# Patient Record
Sex: Female | Born: 1995 | Race: Black or African American | Marital: Single | Smoking: Never smoker
Health system: Southern US, Community
[De-identification: ages and names within clinical notes are randomized; demographics above are authoritative.]

---

## 2017-04-02 NOTE — L&D Delivery Note (Addendum)
Patient: Kim Blanchard MRN: [id number]  GBS status: unknown, IAP given  (PCN)  Patient is a 22 y.o. now G1P0101 s/p NSVD at [redacted]w[redacted]d, who was admitted for PPROM. SROM 64h 19m prior to delivery with clear fluid.    Delivery Note At 9:13 PM a viable female was delivered via Vaginal, Spontaneous (Presentation: ROA.)  APGAR: 7,9 ; weight pending.   Placenta status: Spontaneous, intact.  Cord: 3 vessel, intact with the following complications: none   Anesthesia: Epidural  Episiotomy: None Lacerations: hemostatic labial abrasion  Suture Repair: N/A Est. Blood Loss (mL): 50   Head delivered right OA. No nuchal cord present. Shoulder and body delivered in usual fashion. Infant with spontaneous cry, placed on mother's abdomen, dried and bulb suctioned. Cord clamped x 2 after 1-minute delay, and cut by family member. NICU at bedside via delivery call for further evaluation of pre-term infant. Cord blood drawn. Placenta delivered spontaneously with gentle cord traction. Fundus firm with massage and Pitocin. Perineum inspected and found to have two superficial labial abrasions, which were found to be hemostatic.  Mom to postpartum.  Baby to NICU.   Allayne Stack 01/18/2018, 9:26 PM  Attestation: I have seen this patient and agree with the resident's documentation. I was present for the entire delivery.   Cristal Deer. Earlene Plater, DO OB/GYN Fellow

## 2017-07-31 ENCOUNTER — Ambulatory Visit: Payer: Self-pay

## 2017-08-05 ENCOUNTER — Ambulatory Visit: Payer: Self-pay

## 2017-08-08 ENCOUNTER — Emergency Department (HOSPITAL_COMMUNITY)
Admission: EM | Admit: 2017-08-08 | Discharge: 2017-08-08 | Disposition: A | Discharge status: Home / Self Care | Payer: Medicaid Other | Attending: Emergency Medicine | Admitting: Emergency Medicine

## 2017-08-08 ENCOUNTER — Other Ambulatory Visit (HOSPITAL_COMMUNITY): Payer: Self-pay

## 2017-08-08 ENCOUNTER — Other Ambulatory Visit: Payer: Self-pay

## 2017-08-08 ENCOUNTER — Emergency Department (HOSPITAL_COMMUNITY): Payer: Medicaid Other

## 2017-08-08 ENCOUNTER — Encounter (HOSPITAL_COMMUNITY): Payer: Self-pay | Admitting: Emergency Medicine

## 2017-08-08 DIAGNOSIS — O219 Vomiting of pregnancy, unspecified: Secondary | ICD-10-CM

## 2017-08-08 DIAGNOSIS — Z3A11 11 weeks gestation of pregnancy: Secondary | ICD-10-CM | POA: Diagnosis not present

## 2017-08-08 DIAGNOSIS — R102 Pelvic and perineal pain: Secondary | ICD-10-CM | POA: Diagnosis not present

## 2017-08-08 DIAGNOSIS — E860 Dehydration: Secondary | ICD-10-CM | POA: Insufficient documentation

## 2017-08-08 LAB — CBC WITH DIFFERENTIAL/PLATELET
BASOS PCT: 0 %
Basophils Absolute: 0 10*3/uL (ref 0.0–0.1)
EOS PCT: 0 %
Eosinophils Absolute: 0 10*3/uL (ref 0.0–0.7)
HCT: 38 % (ref 36.0–46.0)
HEMOGLOBIN: 13.6 g/dL (ref 12.0–15.0)
Lymphocytes Relative: 39 %
Lymphs Abs: 2.1 10*3/uL (ref 0.7–4.0)
MCH: 30.3 pg (ref 26.0–34.0)
MCHC: 35.8 g/dL (ref 30.0–36.0)
MCV: 84.6 fL (ref 78.0–100.0)
MONO ABS: 0.4 10*3/uL (ref 0.1–1.0)
MONOS PCT: 7 %
NEUTROS PCT: 54 %
Neutro Abs: 3 10*3/uL (ref 1.7–7.7)
Platelets: 233 10*3/uL (ref 150–400)
RBC: 4.49 MIL/uL (ref 3.87–5.11)
RDW: 11.9 % (ref 11.5–15.5)
WBC: 5.5 10*3/uL (ref 4.0–10.5)

## 2017-08-08 LAB — I-STAT BETA HCG BLOOD, ED (MC, WL, AP ONLY): I-stat hCG, quantitative: >2000 m[IU]/mL — ABNORMAL HIGH (ref <5)

## 2017-08-08 LAB — COMPREHENSIVE METABOLIC PANEL
ALK PHOS: 49 U/L (ref 38–126)
ALT: 20 U/L (ref 14–54)
AST: 18 U/L (ref 15–41)
Albumin: 3.9 g/dL (ref 3.5–5.0)
Anion gap: 7 (ref 5–15)
BILIRUBIN TOTAL: 0.8 mg/dL (ref 0.3–1.2)
CALCIUM: 9.3 mg/dL (ref 8.9–10.3)
CHLORIDE: 107 mmol/L (ref 101–111)
CO2: 22 mmol/L (ref 22–32)
CREATININE: 0.67 mg/dL (ref 0.44–1.00)
GFR calc non Af Amer: >60 mL/min (ref >60)
GLUCOSE: 95 mg/dL (ref 65–99)
Potassium: 4.1 mmol/L (ref 3.5–5.1)
Sodium: 136 mmol/L (ref 135–145)
Total Protein: 6.6 g/dL (ref 6.5–8.1)

## 2017-08-08 LAB — URINALYSIS, ROUTINE W REFLEX MICROSCOPIC
Bilirubin Urine: NEGATIVE
GLUCOSE, UA: NEGATIVE mg/dL
HGB URINE DIPSTICK: NEGATIVE
Ketones, ur: 80 mg/dL — AB
LEUKOCYTES UA: NEGATIVE
Nitrite: NEGATIVE
PH: 6 (ref 5.0–8.0)
Protein, ur: NEGATIVE mg/dL
Specific Gravity, Urine: 1.024 (ref 1.005–1.030)

## 2017-08-08 LAB — LIPASE, BLOOD: Lipase: 26 U/L (ref 11–51)

## 2017-08-08 MED ORDER — ONDANSETRON HCL 4 MG PO TABS
4.0000 mg | ORAL_TABLET | Freq: Once | ORAL | Status: AC
  Administered 2017-08-08: 4 mg via ORAL
  Filled 2017-08-08: qty 1

## 2017-08-08 MED ORDER — ONDANSETRON HCL 4 MG PO TABS: 4.0000 mg | ORAL_TABLET | Freq: Three times a day (TID) | ORAL | 0 refills | Status: DC | PRN

## 2017-08-08 NOTE — ED Triage Notes (Signed)
Primary language is [place]. Pt states she went to work at [employer] yesterday and had a sudden feeling of dizziness. Reports on going dizziness when standing for too long or when she first stands and states she has been having lower abdominal pain. Pt is 2 months pregnant in her first pregnancy.

## 2017-08-08 NOTE — ED Provider Notes (Signed)
Patient placed in Quick Look pathway, seen and evaluated   Chief Complaint: Dizziness, lower abdominal cramping, near syncope  HPI:   Patient reports since yesterday she been having dizziness that she describes as near syncope and lightheadedness.  Worse when she goes from sitting to standing or walks for prolonged periods.  Denies any chest pain or shortness of breath.  She does report some lower abdominal cramping.  Reports nausea and vomiting.  Patient is approximately 2 months pregnant with her first pregnancy has not seen a OB/GYN.  Denies any leakage of fluid, vaginal bleeding.  Denies any urinary symptoms.  No history of same.  ROS: Abdominal pain, dizziness, lightheadedness, nausea, vomiting Denies vaginal bleeding, fevers, chest pain, shortness of breath (one)  Physical Exam:   Gen: No distress  Neuro: Awake and Alert  Skin: Warm    Focused Exam: Heart regular rate and rhythm with no rubs murmurs or gallops.  Lungs clear to auscultation bilaterally.  No focal abdominal tenderness.  Bowel sounds present in all 4 quadrants.  Neurovascular intact in all extremities.  Grip strength equal bilaterally.   Initiation of care has begun. The patient has been counseled on the process, plan, and necessity for staying for the completion/evaluation, and the remainder of the medical screening examination   Discussed with the patient that exiting the department prior to completion of the work-up is AMA and there is no guarantee that there are no emergency medical conditions present.     Rise Mu, PA-C 08/08/17 1944    Gerhard Munch, MD 08/08/17 2014

## 2017-08-08 NOTE — Discharge Instructions (Signed)
Use Zofran as needed for nausea or vomiting. It is very important that you are staying well-hydrated with water.  You should be drinking enough water so that your urine is clear to pale yellow. Follow-up with women's health, whose contact information is listed below.  When you call, you should say that you are pregnant and need to establish prenatal care.  The ER sent you. Return to the ER or the women's health hospital if you develop severe abdominal pain, vaginal bleeding, blood in your urine, or any new or concerning symptoms.

## 2017-08-09 NOTE — ED Provider Notes (Signed)
[facility] EMERGENCY DEPARTMENT Provider Note   CSN: [id number] Arrival date & time: 08/08/17  1842     History   Chief Complaint Chief Complaint  Patient presents with  . Near Syncope  . Abdominal Pain    HPI Kim Blanchard is a 22 y.o. female presenting for evaluation of dizziness and lower abdominal pain.  Patient's friend in the room and helped with translation.  Patient states for the past week, she has been having increasing dizziness/lightheadedness when going from sitting to standing, or when bending for extended periods of time.  This worsened yesterday.  She states she feels like she needs to pass out when this happens.  Symptoms resolved when she sits or lays down.  She states that she is currently pregnant with her first pregnancy, and has been having no issues until recently.  She does report nausea and vomiting for the past several weeks, states she has not been eating or drinking well.  She has not taken anything for her symptoms.  Since the beginning of her pregnancy, she has been having some lower abdominal pain.  This is present after she urinates and with certain movements.  Pain is intermittent, and not severe.  It is described as a cramping.  Nothing makes it better or worse.  She denies fevers, chills, chest pain, shortness of breath, upper abdominal pain, urinary symptoms, or abnormal bowel movements.  She has no other medical problems, does not take medications daily.  She has not followed up with OB/GYN or established prenatal care.  She denies vaginal bleeding or discharge.  HPI  History reviewed. No pertinent past medical history.  There are no active problems to display for this patient.   History reviewed. No pertinent surgical history.   OB History    Gravida  1   Para      Term      Preterm      AB      Living        SAB      TAB      Ectopic      Multiple      Live Births               Home Medications     Prior to Admission medications   Medication Sig Start Date End Date Taking? Authorizing Provider  ondansetron (ZOFRAN) 4 MG tablet Take 1 tablet (4 mg total) by mouth every 8 (eight) hours as needed for nausea or vomiting. 08/08/17   Laconya Clere, PA-C    Family History No family history on file.  Social History Social History   Tobacco Use  . Smoking status: Never Smoker  . Smokeless tobacco: Never Used  Substance Use Topics  . Alcohol use: Not Currently  . Drug use: Not Currently     Allergies   Patient has no known allergies.   Review of Systems Review of Systems  Gastrointestinal: Positive for nausea and vomiting.  Genitourinary: Positive for pelvic pain. Negative for dysuria, frequency, hematuria, vaginal bleeding, vaginal discharge and vaginal pain.  Neurological: Positive for light-headedness.  All other systems reviewed and are negative.    Physical Exam Updated Vital Signs BP 109/61 (BP Location: Right Arm)   Pulse 72   Temp 98.5 F (36.9 C) (Oral)   Resp 15   SpO2 100%   Physical Exam  Constitutional: She is oriented to person, place, and time. She appears well-developed and well-nourished. No distress.  Sitting comfortably  in bed in no apparent distress.  HENT:  Head: Normocephalic and atraumatic.  MM moist  Eyes: Pupils are equal, round, and reactive to light. Conjunctivae and EOM are normal.  Neck: Normal range of motion. Neck supple.  Cardiovascular: Normal rate, regular rhythm and intact distal pulses.  Pulmonary/Chest: Effort normal and breath sounds normal. No respiratory distress. She has no wheezes.  Abdominal: Soft. Bowel sounds are normal. She exhibits no distension and no mass. There is tenderness. There is no rebound and no guarding.  Pt reports mild tenderness palpation of generalized lower abdomen without rigidity, guarding, or distention.  No change in facial expression or guarding with palpation.  Musculoskeletal: Normal range of  motion.  Neurological: She is alert and oriented to person, place, and time.  Skin: Skin is warm and dry.  Psychiatric: She has a normal mood and affect.  Nursing note and vitals reviewed.    ED Treatments / Results  Labs (all labs ordered are listed, but only abnormal results are displayed) Labs Reviewed  COMPREHENSIVE METABOLIC PANEL - Abnormal; Notable for the following components:      Result Value   BUN <5 (*)    All other components within normal limits  URINALYSIS, ROUTINE W REFLEX MICROSCOPIC - Abnormal; Notable for the following components:   APPearance HAZY (*)    Ketones, ur 80 (*)    All other components within normal limits  I-STAT BETA HCG BLOOD, ED ([facility], [facility], [facility] ONLY) - Abnormal; Notable for the following components:   I-stat hCG, quantitative >2,000.0 (*)    All other components within normal limits  CBC WITH DIFFERENTIAL/PLATELET  LIPASE, BLOOD    EKG EKG Interpretation  Date/Time:  Thursday Aug 08 2017 19:45:18 EDT Ventricular Rate:  86 PR Interval:  142 QRS Duration: 68 QT Interval:  346 QTC Calculation: 414 R Axis:   29 Text Interpretation:  Normal sinus rhythm Normal ECG Normal ECG Confirmed by Gerhard Munch [phone]) on 08/08/2017 11:03:19 PM   Radiology US Ob Comp < 14 Wks  Result Date: 08/08/2017 CLINICAL DATA:  22 year old female with positive HCG level presenting with pelvic cramping. LMP: 05/19/2017 corresponding to an estimated gestational age of [redacted] weeks, 4 days. EXAM: OBSTETRIC <14 WK ULTRASOUND TECHNIQUE: Transabdominal ultrasound was performed for evaluation of the gestation as well as the maternal uterus and adnexal regions. COMPARISON:  None. FINDINGS: Intrauterine gestational sac: Single intrauterine gestational sac. Yolk sac:  Not seen Embryo:  Present Cardiac Activity: Detected Heart Rate: 160 bpm CRL:   61 mm   12 w 4 d                  [place] EDC: 02/14/2018 Subchorionic hemorrhage: Faint hypoechoic area in the fundus is favored to represent  artifact and vessels. A minimal subchorionic hemorrhage is less likely. Maternal uterus/adnexae: Not visualized IMPRESSION: Single live intrauterine pregnancy with an estimated gestational age of [redacted] weeks, 4 days based on today's crown-rump length. Electronically Signed   By: Elgie Collard M.D.   On: 08/08/2017 22:09    Procedures Procedures (including critical care time)  Medications Ordered in ED Medications  ondansetron (ZOFRAN) tablet 4 mg (4 mg Oral Given 08/08/17 2307)     Initial Impression / Assessment and Plan / ED Course  I have reviewed the triage vital signs and the nursing notes.  Pertinent labs & imaging results that were available during my care of the patient were reviewed by me and considered in my medical decision making (see chart  for details).     Patient presenting for evaluation of lightheadedness and lower abdominal pain.  Physical exam reassuring, patient appears nontoxic.  Blood pressure is stable.  Labs reassuring, creatinine stable.  Hemoglobin stable.  Urine without infection.  Ultrasound shows IUP.  Shows an area vasculature versus hemorrhage.  As patient without focal tenderness and normal labs, more likely vasculature.  Discussed with patient that ultrasound was abnormal, and she should follow-up with OB/GYN.  No sign of adnexal abnormalities.  Lower abdominal pain likely related to gestation.  Lightheadedness likely related to nausea, vomiting, and dehydration.  As symptoms are present when patient changes position, and she has had recent nausea and vomiting.  Doubt intracranial cause for her symptoms.  EKG without signs of STEMI.  Discussed findings with patient.  Discussed treatment with antiemetics and hydration.  Encouraged patient to follow-up with OB/GYN.  At this time, patient appears a for discharge.  Return precautions given.  Patient states she understands and agrees to plan.   Final Clinical Impressions(s) / ED Diagnoses   Final diagnoses:  Nausea  and vomiting during pregnancy  Dehydration    ED Discharge Orders        Ordered    ondansetron (ZOFRAN) 4 MG tablet  Every 8 hours PRN     08/08/17 2259       Langley Flatley, PA-C 08/09/17 0026    Melene Plan, DO 08/09/17 1532

## 2017-09-02 ENCOUNTER — Other Ambulatory Visit (HOSPITAL_COMMUNITY): Admission: RE | Admit: 2017-09-02 | Payer: Medicaid Other | Source: Ambulatory Visit

## 2017-09-02 ENCOUNTER — Ambulatory Visit (INDEPENDENT_AMBULATORY_CARE_PROVIDER_SITE_OTHER): Payer: Self-pay | Admitting: Advanced Practice Midwife

## 2017-09-02 ENCOUNTER — Encounter: Payer: Self-pay | Admitting: Advanced Practice Midwife

## 2017-09-02 DIAGNOSIS — Z789 Other specified health status: Secondary | ICD-10-CM

## 2017-09-02 DIAGNOSIS — Z3402 Encounter for supervision of normal first pregnancy, second trimester: Secondary | ICD-10-CM

## 2017-09-02 DIAGNOSIS — Z124 Encounter for screening for malignant neoplasm of cervix: Secondary | ICD-10-CM

## 2017-09-02 DIAGNOSIS — Z113 Encounter for screening for infections with a predominantly sexual mode of transmission: Secondary | ICD-10-CM

## 2017-09-02 DIAGNOSIS — Z3400 Encounter for supervision of normal first pregnancy, unspecified trimester: Secondary | ICD-10-CM

## 2017-09-02 LAB — POCT URINALYSIS DIP (DEVICE)
BILIRUBIN URINE: NEGATIVE
Glucose, UA: NEGATIVE mg/dL
Hgb urine dipstick: NEGATIVE
Ketones, ur: NEGATIVE mg/dL
Leukocytes, UA: NEGATIVE
NITRITE: NEGATIVE
PH: 6 (ref 5.0–8.0)
Protein, ur: NEGATIVE mg/dL
Specific Gravity, Urine: >=1.03 (ref 1.005–1.030)
UROBILINOGEN UA: 0.2 mg/dL (ref 0.0–1.0)

## 2017-09-02 NOTE — Progress Notes (Signed)
Here for new ob visit. States is sure of LMP. Not candidate for Bab[employer]s since she speaks [place]. Declines MyChart.

## 2017-09-02 NOTE — Progress Notes (Signed)
Patient ID: Kim Blanchard, female   DOB: 10/31/1995, 22 y.o.   MRN: [id number]  Subjective:   Kim Blanchard is a 22 y.o. G1P0 at 7074w1d by LMP being seen today for her first obstetrical visit.  Her obstetrical history is significant for NA. Patient does intend to breast feed. Pregnancy history fully reviewed.  Patient reports no complaints.  HISTORY: OB History  Gravida Para Term Preterm AB Living  1 0 0 0 0 0  SAB TAB Ectopic Multiple Live Births  0 0 0 0 0    # Outcome Date GA Lbr Len/2nd Weight Sex Delivery Anes PTL Lv  1 Current             Last pap smear was done unsure and was NA  History reviewed. No pertinent past medical history. History reviewed. No pertinent surgical history. History reviewed. No pertinent family history. Social History   Tobacco Use  . Smoking status: Never Smoker  . Smokeless tobacco: Never Used  Substance Use Topics  . Alcohol use: Never    Frequency: Never  . Drug use: Never   No Known Allergies Current Outpatient Medications on File Prior to Visit  Medication Sig Dispense Refill  . ondansetron (ZOFRAN) 4 MG tablet Take 1 tablet (4 mg total) by mouth every 8 (eight) hours as needed for nausea or vomiting. 12 tablet 0   No current facility-administered medications on file prior to visit.     Review of Systems Pertinent items noted in HPI and remainder of comprehensive ROS otherwise negative.  Exam   Vitals:   09/02/17 1033 09/02/17 1035  BP: 120/70   Pulse: 96   Weight: 138 lb 3.2 oz (62.7 kg)   Height:  5\' 1"  (1.549 m)   Fetal Heart Rate (bpm): 156  Uterus:   15 weeks size +FHT with doppler 156 BPM   Pelvic Exam: Perineum: no hemorrhoids, normal perineum   Vulva: normal external genitalia, no lesions   Vagina:  normal mucosa, normal discharge   Cervix: no lesions and normal, pap smear done.    Adnexa: normal adnexa and no mass, fullness, tenderness   Bony Pelvis: average  System: General: well-developed, well-nourished  female in no acute distress   Breast:  normal appearance, no masses or tenderness   Skin: normal coloration and turgor, no rashes, marks on her abdomen, patient states "those are from [place]", likely scarification    Neurologic: oriented, normal, negative, normal mood   Extremities: normal strength, tone, and muscle mass, ROM of all joints is normal   HEENT PERRLA, extraocular movement intact and sclera clear, anicteric   Mouth/Teeth mucous membranes moist, pharynx normal without lesions and dental hygiene good   Neck supple and no masses   Cardiovascular: regular rate and rhythm   Respiratory:  no respiratory distress, normal breath sounds   Abdomen: soft, non-tender; bowel sounds normal; no masses,  no organomegaly    Assessment:   Pregnancy: G1P0 Patient Active Problem List   Diagnosis Date Noted  . Supervision of normal first pregnancy, antepartum 09/02/2017  . Language barrier affecting health care 09/02/2017     Plan:  1. Supervision of normal first pregnancy, antepartum - Culture, OB Urine - Cytology - PAP - Hemoglobinopathy Evaluation - Obstetric Panel, Including HIV - US MFM OB COMP + 14 WK; Future - Consult with Dr. Earlene Plateravis, will keep LMP dating because US was not more than 7 days difference . - Live intp here for entire visit   Initial labs drawn.  Continue prenatal vitamins. Genetic Screening discussed, First trimester screen, Quad screen and NIPS: undecided. Ultrasound discussed; fetal anatomic survey: requested. Problem list reviewed and updated. The nature of [facility] with multiple MDs and other Advanced Practice Providers was explained to patient; also emphasized that residents, students are part of our team. Routine obstetric precautions reviewed. 50% of 45 min visit spent in counseling and coordination of care. Return in about 1 month (around 09/30/2017).

## 2017-09-02 NOTE — Patient Instructions (Addendum)
May buy prenatal vitamins over the counter. Be sure they have folic acid.   Childbirth Education Options: [facility] Department Classes:  Childbirth education classes can help you get ready for a positive parenting experience. You can also meet other expectant parents and get free stuff for your baby. Each class runs for five weeks on the same night and costs $45 for the mother-to-be and her support person. Medicaid covers the cost if you are eligible. Call [phone] to register. [facility] Childbirth Education:  [phone] or [phone] or [email or web address]  Baby & Me Class: Discuss newborn & infant parenting and family adjustment issues with other new mothers in a relaxed environment. Each week brings a new speaker or baby-centered activity. We encourage new mothers to join [place] every Thursday at 11:00am. Babies birth until crawling. No registration or fee. Daddy [employer]: This course offers Dads-to-be the tools and knowledge needed to feel confident on their journey to becoming new fathers. Experienced dads, who have been trained as coaches, teach dads-to-be how to hold, comfort, diaper, swaddle and play with their infant while being able to support the new mom as well. A class for men taught by men. $25/dad Big Brother/Big Sister: Let your children share in the joy of a new brother or sister in this special class designed just for them. Class includes discussion about how families care for babies: swaddling, holding, diapering, safety as well as how they can be helpful in their new role. This class is designed for children ages 71 to 39, but any age is welcome. Please register each child individually. $5/child  Mom Talk: This mom-led group offers support and connection to mothers as they journey through the adjustments and struggles of that sometimes overwhelming first year after the birth of a child. Tuesdays at 10:00am and Thursdays at 6:00pm. Babies welcome. No  registration or fee. Breastfeeding Support Group: This group is a mother-to-mother support circle where moms have the opportunity to share their breastfeeding experiences. A Lactation Consultant is present for questions and concerns. Meets each Tuesday at 11:00am. No fee or registration. Breastfeeding Your Baby: Learn what to expect in the first days of breastfeeding your newborn.  This class will help you feel more confident with the skills needed to begin your breastfeeding experience. Many new mothers are concerned about breastfeeding after leaving the hospital. This class will also address the most common fears and challenges about breastfeeding during the first few weeks, months and beyond. (call for fee) Comfort Techniques and Tour: This 2 hour interactive class will provide you the opportunity to learn & practice hands-on techniques that can help relieve some of the discomfort of labor and encourage your baby to rotate toward the best position for birth. You and your partner will be able to try a variety of labor positions with birth balls and rebozos as well as practice breathing, relaxation, and visualization techniques. A tour of the [facility] is included with this class. $20 per registrant and support person Childbirth Class- Weekend Option: This class is a Weekend version of our Birth & Baby series. It is designed for parents who have a difficult time fitting several weeks of classes into their schedule. It covers the care of your newborn and the basics of labor and childbirth. It also includes a [place] [facility] and lunch. The class is held two consecutive days: beginning on Friday evening from 6:30 - 8:30 p.m. and the next day, Saturday from 9  a.m. - 4 p.m. (call for fee) Waterbirth Class: Interested in a waterbirth?  This informational class will help you discover whether waterbirth is the right fit for you. Education about waterbirth  itself, supplies you would need and how to assemble your support team is what you can expect from this class. Some obstetrical practices require this class in order to pursue a waterbirth. (Not all obstetrical practices offer waterbirth-check with your healthcare provider.) Register only the expectant mom, but you are encouraged to bring your partner to class! Required if planning waterbirth, no fee. Infant/Child CPR: Parents, grandparents, babysitters, and friends learn Cardio-Pulmonary Resuscitation skills for infants and children. You will also learn how to treat both conscious and unconscious choking in infants and children. This Family & Friends program does not offer certification. Register each participant individually to ensure that enough mannequins are available. (Call for fee) Grandparent Love: Expecting a grandbaby? This class is for you! Learn about the latest infant care and safety recommendations and ways to support your own child as he or she transitions into the parenting role. Taught by Registered Nurses who are childbirth instructors, but most importantly...they are grandmothers too! $10/person. Childbirth Class- Natural Childbirth: This series of 5 weekly classes is for expectant parents who want to learn and practice natural methods of coping with the process of labor and childbirth. Relaxation, breathing, massage, visualization, role of the partner, and helpful positioning are highlighted. Participants learn how to be confident in their body's ability to give birth. This class will empower and help parents make informed decisions about their own care. Includes discussion that will help new parents transition into the immediate postpartum period. [facility] is included. We suggest taking this class between 25-32 weeks, but it's only a recommendation. $75 per registrant and one support person or $30 Medicaid. Childbirth Class- 3 week Series: This option of  3 weekly classes helps you and your labor partner prepare for childbirth. Newborn care, labor & birth, cesarean birth, pain management, and comfort techniques are discussed and a [place] [facility] is included. The class meets at the same time, on the same day of the week for 3 consecutive weeks beginning with the starting date you choose. $60 for registrant and one support person.  Marvelous Multiples: Expecting twins, triplets, or more? This class covers the differences in labor, birth, parenting, and breastfeeding issues that face multiples' parents. NICU tour is included. Led by a Certified Childbirth Educator who is the mother of twins. No fee. Caring for Baby: This class is for expectant and adoptive parents who want to learn and practice the most up-to-date newborn care for their babies. Focus is on birth through the first six weeks of life. Topics include feeding, bathing, diapering, crying, umbilical cord care, circumcision care and safe sleep. Parents learn to recognize symptoms of illness and when to call the pediatrician. Register only the mom-to-be and your partner or support person can plan to come with you! $10 per registrant and support person Childbirth Class- online option: This online class offers you the freedom to complete a Birth and Baby series in the comfort of your own home. The flexibility of this option allows you to review sections at your own pace, at times convenient to you and your support people. It includes additional video information, animations, quizzes, and extended activities. Get organized with helpful eClass tools, checklists, and trackers. Once you register online for the class, you will receive an email  within a few days to accept the invitation and begin the class when the time is right for you. The content will be available to you for 60 days. $60 for 60 days of online access for you and your support people.  Local Doulas: Natural Baby  Doulas [email or web address] Tel: [phone] [email or web address] [employer] [phone] [email or web address] The Labor Hassell Halim  (also do waterbirth tub rental) [phone] [email or web address] [facility] Birth Doula [phone] [email or web address].fi Sacred Rhythms  [phone] [email or web address] [employer] (PADA) [email or web address] [place] [facility] Baby  [email or web address] Considering Waterbirth? Guide for patients at [facility] [employer]  Why consider waterbirth?  . Gentle birth for babies . Less pain medicine used in labor . May allow for passive descent/less pushing . May reduce perineal tears  . More mobility and instinctive maternal position changes . Increased maternal relaxation . Reduced blood pressure in labor  Is waterbirth safe? What are the risks of infection, drowning or other complications?  . Infection: o Very low risk (3.7 % for tub vs 4.8% for bed) o 7 in 8000 waterbirths with documented infection o Poorly cleaned equipment most common cause o Slightly lower group B strep transmission rate  . Drowning o Maternal:  - Very low risk   - Related to seizures or fainting o Newborn:  - Very low risk. No evidence of increased risk of respiratory problems in multiple large studies - Physiological protection from breathing under water - Avoid underwater birth if there are any fetal complications - Once baby's head is out of the water, keep it out.  . Birth complication o Some reports of cord trauma, but risk decreased by bringing baby to surface gradually o No evidence of increased risk of shoulder dystocia. Mothers can usually change positions faster in water than in a bed, possibly aiding the maneuvers to free the shoulder.   You  must attend a Doren Custard class at [facility]  3rd Wednesday of every month from 7-9pm  [employer] by calling [phone] or online at [email or web address]  Bring [place] the certificate from the class to your prenatal appointment  Meet with a midwife at 36 weeks to see if you can still plan a waterbirth and to sign the consent.   Purchase or rent the following supplies:   Water Birth Pool (Birth Pool in a Box or [place] for instance)  (Tubs start ~$125)  Single-use disposable tub liner designed for your brand of tub  New garden hose labeled "lead-free", "suitable for drinking water",  Electric drain pump to remove water (We recommend 792 gallon per hour or greater pump.)   Separate garden hose to remove the dirty water  Fish net  Bathing suit top (optional)  Long-handled mirror (optional)  Places to purchase or rent supplies  GotWebTools.is for tub purchases and supplies  Waterbirthsolutions.com for tub purchases and supplies  The Labor Ladies (www.[email or web address]) $275 for tub rental/set-up & take down/kit   [employer] (http://www.fleming.com/.htm) Information regarding doulas (labor support) who provide pool rentals  Our practice has a Birth Pool in a Box tub at the hospital that you may borrow on a first-come-first-served basis. It is your responsibility to to set up, clean and break down the tub. We cannot guarantee the availability of this tub in advance. You are responsible for bringing all accessories listed above. If you do not have all necessary supplies you cannot have a waterbirth.    Things that would prevent you from having a waterbirth:  Premature, <  37wks  Previous cesarean birth  Presence of thick meconium-stained fluid  Multiple gestation (Twins, triplets, etc.)  Uncontrolled diabetes or gestational diabetes requiring medication  Hypertension requiring medication or diagnosis of pre-eclampsia  Heavy  vaginal bleeding  Non-reassuring fetal heart rate  Active infection (MRSA, etc.). Group B Strep is NOT a contraindication for  waterbirth.  If your labor has to be induced and induction method requires continuous  monitoring of the baby's heart rate  Other risks/issues identified by your obstetrical provider  Please remember that birth is unpredictable. Under certain unforeseeable circumstances your provider may advise against giving birth in the tub. These decisions will be made on a case-by-case basis and with the safety of you and your baby as our highest priority.    Screening Tests for Genetic Problems and Birth Defects  Screening tests separate those pregnant women whose baby might have certain conditions from those who probably don't have the birth defect being tested for. There are no physical risks to you or your baby from having any of the screening tests (other than minor pain associated with having a blood sample taken). Serum screens are blood tests. These tests tell you if there is a higher chance your baby has a defect in the spine or brain, or Down syndrome. A high or low result on this test does not mean your baby has a problem for sure. These results only identify which women should have diagnostic tests to find out if something is wrong. There are several different kinds of serum screens. Depending on the test, they are done between 10 and 20 weeks of pregnancy. Ultrasound is a way to look at your baby inside your uterus (womb) using sound waves that make an image of the baby on a monitor. Ultrasound can pick up certain problems depending on when in pregnancy it is done. An ultrasound done at 16 to 20 weeks of pregnancy shows your baby's heart, brain, and other organs. Most women are offered an ultrasound at this time in their pregnancies. Sometimes ultrasound can miss problems.   Name of the test and what it is When in pregnancy the test is done What the test tells you What  happens if the test is abnormal  First-trimester screen blood test and nuchal translucency (NT) ultrasound. 11-14 weeks Detects 8-9 of 10 babies with Down syndrome. Genetic counseling is offered to review your results in depth, and discuss further testing options. Further testing could include CVS or amniocentesis.   Quad Screen - a single blood test 15-20 weeks Detects 8 of 10 babies with Down syndrome. Can also detect neural tube defects like spina bifida.  Genetic counseling is offered to review your results in depth, and discuss further testing options. Further testing could include amniocentesis   Panorama - a single blood test Any time after 10 weeks Detects 9 of 10 babies with Down syndrome (trisomy 21) or trisomy 35 or 13. This test can also tell you the sex of your baby.  Genetic counseling is offered to review your results in depth, and discuss further testing options. Further testing could include CVS or amniocentesis.  AFP only - a single blood test 14-21 weeks This test can be done to look for neural tube defects (like spina bifida). It is ONLY done if you have already had a FIRST Screen or Panorama test Detects 7- 9 of 10 babies with neural tube defects.  Genetic counseling is offered to review your results in depth, and discuss further testing  options.  Targeted ultrasound will be offered.    How do I decide? Some important questions to ask when making decisions about these tests are: . What information will the test give me? . How accurate is this test? . What risks are there for my baby and for me if I have this test? . What would I do with the information from the test? . Would I do anything different if the test results are abnormal? . Would I agree to more tests to find out if something is really wrong with my baby?

## 2017-09-03 LAB — OBSTETRIC PANEL, INCLUDING HIV
ANTIBODY SCREEN: NEGATIVE
BASOS: 0 %
Basophils Absolute: 0 10*3/uL (ref 0.0–0.2)
EOS (ABSOLUTE): 0.1 10*3/uL (ref 0.0–0.4)
Eos: 2 %
HEMATOCRIT: 36.8 % (ref 34.0–46.6)
HEMOGLOBIN: 12.5 g/dL (ref 11.1–15.9)
HEP B S AG: NEGATIVE
HIV SCREEN 4TH GENERATION: NONREACTIVE
IMMATURE GRANS (ABS): 0 10*3/uL (ref 0.0–0.1)
Immature Granulocytes: 0 %
LYMPHS: 29 %
Lymphocytes Absolute: 1.9 10*3/uL (ref 0.7–3.1)
MCH: 30.2 pg (ref 26.6–33.0)
MCHC: 34 g/dL (ref 31.5–35.7)
MCV: 89 fL (ref 79–97)
Monocytes Absolute: 0.4 10*3/uL (ref 0.1–0.9)
Monocytes: 6 %
NEUTROS ABS: 4.2 10*3/uL (ref 1.4–7.0)
Neutrophils: 63 %
Platelets: 244 10*3/uL (ref 150–450)
RBC: 4.14 x10E6/uL (ref 3.77–5.28)
RDW: 13.2 % (ref 12.3–15.4)
RPR: NONREACTIVE
RUBELLA: 4.67 {index} (ref >0.99)
Rh Factor: POSITIVE
WBC: 6.6 10*3/uL (ref 3.4–10.8)

## 2017-09-03 LAB — CYTOLOGY - PAP
Adequacy: ABSENT
CHLAMYDIA, DNA PROBE: NEGATIVE
Diagnosis: NEGATIVE
NEISSERIA GONORRHEA: NEGATIVE

## 2017-09-03 LAB — HEMOGLOBINOPATHY EVALUATION
Ferritin: 141 ng/mL (ref 15–150)
HGB C: 0 %
HGB F QUANT: 1.8 % (ref 0.0–2.0)
HGB SOLUBILITY: NEGATIVE
Hgb A2 Quant: 2.8 % (ref 1.8–3.2)
Hgb A: 95.4 % — ABNORMAL LOW (ref 96.4–98.8)
Hgb S: 0 %
Hgb Variant: 0 %

## 2017-09-04 LAB — URINE CULTURE, OB REFLEX

## 2017-09-04 LAB — CULTURE, OB URINE

## 2017-10-04 ENCOUNTER — Encounter: Payer: Self-pay | Admitting: General Practice

## 2017-10-04 ENCOUNTER — Ambulatory Visit (INDEPENDENT_AMBULATORY_CARE_PROVIDER_SITE_OTHER): Payer: Self-pay | Admitting: Obstetrics and Gynecology

## 2017-10-04 VITALS — BP 115/59 | HR 86 | Wt 139.7 lb

## 2017-10-04 DIAGNOSIS — Z3400 Encounter for supervision of normal first pregnancy, unspecified trimester: Secondary | ICD-10-CM

## 2017-10-04 DIAGNOSIS — Z3402 Encounter for supervision of normal first pregnancy, second trimester: Secondary | ICD-10-CM

## 2017-10-04 MED ORDER — PRENATAL VITAMINS 28-0.8 MG PO TABS: 1.0000 | ORAL_TABLET | Freq: Every day | ORAL | 3 refills | Status: DC

## 2017-10-04 NOTE — Progress Notes (Signed)
Interpreter Harriett SineNancy  Anatomy US scheduled for July 16th @ 1000. Pt notified.

## 2017-10-04 NOTE — Progress Notes (Signed)
   PRENATAL VISIT NOTE  Subjective:  Kim Blanchard is a 22 y.o. G1P0 at 7857w5d being seen today for ongoing prenatal care.  [name] for the following issues for this low-risk pregnancy and has Supervision of normal first pregnancy, antepartum and Language barrier affecting health care on their problem list.  Patient reports no complaints.  Contractions: Not present. Vag. Bleeding: None.  Movement: Present. Denies leaking of fluid.   The following portions of the patient's history were reviewed and updated as appropriate: allergies, current medications, past family history, past medical history, past social history, past surgical history and problem list. Problem list updated.  Objective:   Vitals:   10/04/17 1116  BP: (!) 115/59  Pulse: 86  Weight: 139 lb 11.2 oz (63.4 kg)    Fetal Status: Fetal Heart Rate (bpm): 158Movement: Present     General:  Alert, oriented and cooperative. Patient is in no acute distress.  Skin: Skin is warm and dry. No rash noted.   Cardiovascular: Normal heart rate noted  Respiratory: Normal respiratory effort, no problems with respiration noted  Abdomen: Soft, gravid, appropriate for gestational age.  Pain/Pressure: Absent     Pelvic: Cervical exam deferred        Extremities: Normal range of motion.  Edema: None  Mental Status: Normal mood and affect. Normal behavior. Normal judgment and thought content.   Assessment and Plan:  Pregnancy: G1P0 at 8357w5d  1. Supervision of normal first pregnancy, antepartum - US MFM OB COMP + 14 WK; Future - Genetic Screening  Preterm labor symptoms and general obstetric precautions including but not limited to vaginal bleeding, contractions, leaking of fluid and fetal movement were reviewed in detail with the patient. Please refer to After Visit Summary for other counseling recommendations.  Return in about 11 days (around 10/15/2017) for 10:15 for ultrasound .  Future Appointments  Date Time  Provider Department Center  10/15/2017 10:15 AM [facility] US 4 [facility]    Venia CarbonJennifer Jaquasia Doscher, NP

## 2017-10-04 NOTE — Patient Instructions (Addendum)
Contraception Choices Contraception, also called birth control, refers to methods or devices that prevent pregnancy. Hormonal methods Contraceptive implant A contraceptive implant is a thin, plastic tube that contains a hormone. It is inserted into the upper part of the arm. It can remain in place for up to 3 years. Progestin-only injections Progestin-only injections are injections of progestin, a synthetic form of the hormone progesterone. They are given every 3 months by a health care provider. Birth control pills Birth control pills are pills that contain hormones that prevent pregnancy. They must be taken once a day, preferably at the same time each day. Birth control patch The birth control patch contains hormones that prevent pregnancy. It is placed on the skin and must be changed once a week for three weeks and removed on the fourth week. A prescription is needed to use this method of contraception. Vaginal ring A vaginal ring contains hormones that prevent pregnancy. It is placed in the vagina for three weeks and removed on the fourth week. After that, the process is repeated with a new ring. A prescription is needed to use this method of contraception. Emergency contraceptive Emergency contraceptives prevent pregnancy after unprotected sex. They come in pill form and can be taken up to 5 days after sex. They work best the sooner they are taken after having sex. Most emergency contraceptives are available without a prescription. This method should not be used as your only form of birth control. Barrier methods Female condom A female condom is a thin sheath that is worn over the penis during sex. Condoms keep sperm from going inside a woman's body. They can be used with a spermicide to increase their effectiveness. They should be disposed after a single use. Female condom A female condom is a soft, loose-fitting sheath that is put into the vagina before sex. The condom keeps sperm from going  inside a woman's body. They should be disposed after a single use. Diaphragm A diaphragm is a soft, dome-shaped barrier. It is inserted into the vagina before sex, along with a spermicide. The diaphragm blocks sperm from entering the uterus, and the spermicide kills sperm. A diaphragm should be left in the vagina for 6-8 hours after sex and removed within 24 hours. A diaphragm is prescribed and fitted by a health care provider. A diaphragm should be replaced every 1-2 years, after giving birth, after gaining more than 15 lb (6.8 kg), and after pelvic surgery. Cervical cap A cervical cap is a round, soft latex or plastic cup that fits over the cervix. It is inserted into the vagina before sex, along with spermicide. It blocks sperm from entering the uterus. The cap should be left in place for 6-8 hours after sex and removed within 48 hours. A cervical cap must be prescribed and fitted by a health care provider. It should be replaced every 2 years. Sponge A sponge is a soft, circular piece of polyurethane foam with spermicide on it. The sponge helps block sperm from entering the uterus, and the spermicide kills sperm. To use it, you make it wet and then insert it into the vagina. It should be inserted before sex, left in for at least 6 hours after sex, and removed and thrown away within 30 hours. Spermicides Spermicides are chemicals that kill or block sperm from entering the cervix and uterus. They can come as a cream, jelly, suppository, foam, or tablet. A spermicide should be inserted into the vagina with an applicator at least 10-15 minutes before   sex to allow time for it to work. The process must be repeated every time you have sex. Spermicides do not require a prescription. Intrauterine contraception Intrauterine device (IUD) An IUD is a T-shaped device that is put in a woman's uterus. There are two types:  Hormone IUD.This type contains progestin, a synthetic form of the hormone progesterone. This  type can stay in place for 3-5 years.  Copper IUD.This type is wrapped in copper wire. It can stay in place for 10 years.  Permanent methods of contraception Female tubal ligation In this method, a woman's fallopian tubes are sealed, tied, or blocked during surgery to prevent eggs from traveling to the uterus. Hysteroscopic sterilization In this method, a small, flexible insert is placed into each fallopian tube. The inserts cause scar tissue to form in the fallopian tubes and block them, so sperm cannot reach an egg. The procedure takes about 3 months to be effective. Another form of birth control must be used during those 3 months. Female sterilization This is a procedure to tie off the tubes that carry sperm (vasectomy). After the procedure, the man can still ejaculate fluid (semen). Natural planning methods Natural family planning In this method, a couple does not have sex on days when the woman could become pregnant. Calendar method This means keeping track of the length of each menstrual cycle, identifying the days when pregnancy can happen, and not having sex on those days. Ovulation method In this method, a couple avoids sex during ovulation. Symptothermal method This method involves not having sex during ovulation. The woman typically checks for ovulation by watching changes in her temperature and in the consistency of cervical mucus. Post-ovulation method In this method, a couple waits to have sex until after ovulation. Summary  Contraception, also called birth control, means methods or devices that prevent pregnancy.  Hormonal methods of contraception include implants, injections, pills, patches, vaginal rings, and emergency contraceptives.  Barrier methods of contraception can include female condoms, female condoms, diaphragms, cervical caps, sponges, and spermicides.  There are two types of IUDs (intrauterine devices). An IUD can be put in a woman's uterus to prevent pregnancy  for 3-5 years.  Permanent sterilization can be done through a procedure for males, females, or both.  Natural family planning methods involve not having sex on days when the woman could become pregnant. This information is not intended to replace advice given to you by your health care provider. Make sure you discuss any questions you have with your health care provider. Document Released: 03/19/2005 Document Revised: 04/21/2016 Document Reviewed: 04/21/2016 Elsevier Interactive Patient Education  2018 [employer]        US [facility]   [facility]2017 [facility]

## 2017-10-07 ENCOUNTER — Encounter: Payer: Self-pay | Admitting: *Deleted

## 2017-10-09 ENCOUNTER — Encounter (HOSPITAL_COMMUNITY): Payer: Self-pay

## 2017-10-15 ENCOUNTER — Other Ambulatory Visit: Payer: Self-pay | Admitting: Obstetrics and Gynecology

## 2017-10-15 ENCOUNTER — Ambulatory Visit (HOSPITAL_COMMUNITY)
Admission: RE | Admit: 2017-10-15 | Discharge: 2017-10-15 | Disposition: A | Discharge status: Home / Self Care | Payer: Medicaid Other | Source: Ambulatory Visit | Attending: Obstetrics and Gynecology | Admitting: Obstetrics and Gynecology

## 2017-10-15 DIAGNOSIS — Z3402 Encounter for supervision of normal first pregnancy, second trimester: Secondary | ICD-10-CM | POA: Diagnosis not present

## 2017-10-15 DIAGNOSIS — Z3689 Encounter for other specified antenatal screening: Secondary | ICD-10-CM

## 2017-10-15 DIAGNOSIS — Z3A21 21 weeks gestation of pregnancy: Secondary | ICD-10-CM | POA: Insufficient documentation

## 2017-10-15 DIAGNOSIS — Z3400 Encounter for supervision of normal first pregnancy, unspecified trimester: Secondary | ICD-10-CM

## 2017-10-15 DIAGNOSIS — Z363 Encounter for antenatal screening for malformations: Secondary | ICD-10-CM

## 2017-10-16 ENCOUNTER — Encounter: Payer: Self-pay | Admitting: *Deleted

## 2017-10-29 ENCOUNTER — Ambulatory Visit (INDEPENDENT_AMBULATORY_CARE_PROVIDER_SITE_OTHER): Payer: Self-pay | Admitting: Student

## 2017-10-29 DIAGNOSIS — Z3400 Encounter for supervision of normal first pregnancy, unspecified trimester: Secondary | ICD-10-CM

## 2017-10-29 NOTE — Progress Notes (Signed)
Live Language [employer] Inter Lodema PilotAnusia Uwineza

## 2017-10-29 NOTE — Patient Instructions (Signed)
Next visit will be here Glucose Tolerance Test -Don't eat anything after midnight on the morning of her test.  -She will be here for two hours.  -Her appointment will start at 8 am.  -She will get her blood drawn three times

## 2017-10-29 NOTE — Progress Notes (Signed)
   PRENATAL VISIT NOTE  Subjective:  Kim Blanchard is a 22 y.o. G1P0 at 4575w2d being seen today for ongoing prenatal care.  [name] for the following issues for this low-risk pregnancy and has Supervision of normal first pregnancy, antepartum and Language barrier affecting health care on their problem list.   Interpreter was present for encounter.  She reports a husband for support and was counseled on different types of contraception, including Nexplanon and IUD. Educated on fasting for GTT in 4 weeks.  Patient reports no complaints, including abdominal pain, vaginal bleeding, or discharge.  Contractions: Not present. Vag. Bleeding: None.  Movement: Present. Denies leaking of fluid.   The following portions of the patient's history were reviewed and updated as appropriate: allergies, current medications, past family history, past medical history, past social history, past surgical history and problem list. Problem list updated.  Objective:   Vitals:   10/29/17 0934  BP: 109/61  Pulse: 73  Weight: 147 lb 14.4 oz (67.1 kg)    Fetal Status: Fetal Heart Rate (bpm): 150 Fundal Height: 23 cm Movement: Present     General:  Alert, oriented and cooperative. Patient is in no acute distress.  Skin: Skin is warm and dry. No rash noted.   Cardiovascular: Normal heart rate noted  Respiratory: Normal respiratory effort, no problems with respiration noted  Abdomen: Soft, gravid, appropriate for gestational age.  Pain/Pressure: Absent     Pelvic: Cervical exam deferred        Extremities: Normal range of motion.  Edema: None  Mental Status: Normal mood and affect. Normal behavior. Normal judgment and thought content.   Assessment and Plan:  Pregnancy: G1P0 at 5975w2d  1. Supervision of normal first pregnancy, antepartum  - Counseled on contraceptive methods including Nexplanon and IUD. Patient undecided at this time.   - Counseled on fasting for GTT for her next visit in 4  weeks.  Preterm labor symptoms and general obstetric precautions including but not limited to vaginal bleeding, contractions, leaking of fluid and fetal movement were reviewed in detail with the patient. Please refer to After Visit Summary for other counseling recommendations.  Return in about 1 month (around 11/26/2017), or LROB and 2 hour glucose tolerance test.  Future Appointments  Date Time Provider Department Center  11/27/2017  8:20 AM [facility]  11/27/2017  9:15 AM Sharyon Cableogers, Veronica C, CNM [facility]    Margarita RanaHarrison D Rose, Student-PA   I confirm that I have verified the information documented in the physician assistant student's note and that I have also personally reperformed the physical exam and all medical decision making activities.   Luna KitchensKathryn Avier Jech CNM

## 2017-11-26 ENCOUNTER — Other Ambulatory Visit: Payer: Self-pay

## 2017-11-26 DIAGNOSIS — Z3400 Encounter for supervision of normal first pregnancy, unspecified trimester: Secondary | ICD-10-CM

## 2017-11-27 ENCOUNTER — Other Ambulatory Visit: Payer: Self-pay

## 2017-11-27 ENCOUNTER — Ambulatory Visit (INDEPENDENT_AMBULATORY_CARE_PROVIDER_SITE_OTHER): Payer: Self-pay | Admitting: Advanced Practice Midwife

## 2017-11-27 VITALS — BP 115/56 | HR 80

## 2017-11-27 DIAGNOSIS — Z789 Other specified health status: Secondary | ICD-10-CM

## 2017-11-27 DIAGNOSIS — Z23 Encounter for immunization: Secondary | ICD-10-CM

## 2017-11-27 DIAGNOSIS — Z3402 Encounter for supervision of normal first pregnancy, second trimester: Secondary | ICD-10-CM

## 2017-11-27 DIAGNOSIS — Z3400 Encounter for supervision of normal first pregnancy, unspecified trimester: Secondary | ICD-10-CM

## 2017-11-27 NOTE — Progress Notes (Signed)
Live [place]iny[place] Interpreter- [facility]

## 2017-11-27 NOTE — Progress Notes (Signed)
   PRENATAL VISIT NOTE  Subjective:  Kim Blanchard is a 22 y.o. G1P0 at 6754w3d being seen today for ongoing prenatal care.  [name] for the following issues for this low-risk pregnancy and has Supervision of normal first pregnancy, antepartum and Language barrier affecting health care on their problem list.  Patient reports no complaints.  Contractions: Not present. Vag. Bleeding: None.  Movement: Present. Denies leaking of fluid.   The following portions of the patient's history were reviewed and updated as appropriate: allergies, current medications, past family history, past medical history, past social history, past surgical history and problem list. Problem list updated.  Objective:   Vitals:   11/27/17 0840  BP: (!) 115/56  Pulse: 80    Fetal Status: Fetal Heart Rate (bpm): 146   Movement: Present     General:  Alert, oriented and cooperative. Patient is in no acute distress.  Skin: Skin is warm and dry. No rash noted.   Cardiovascular: Normal heart rate noted  Respiratory: Normal respiratory effort, no problems with respiration noted  Abdomen: Soft, gravid, appropriate for gestational age.  Pain/Pressure: Absent     Pelvic: Cervical exam deferred        Extremities: Normal range of motion.  Edema: None  Mental Status: Normal mood and affect. Normal behavior. Normal judgment and thought content.   Assessment and Plan:  Pregnancy: G1P0 at 6554w3d  1. Supervision of normal first pregnancy, antepartum. --Anticipatory guidance about next visits/weeks of pregnancy given. - Tdap vaccine greater than or equal to 7yo IM  2. Language barrier affecting health care --Kinyarwandian interpreter present for today's visit.   Preterm labor symptoms and general obstetric precautions including but not limited to vaginal bleeding, contractions, leaking of fluid and fetal movement were reviewed in detail with the patient. Please refer to After Visit Summary for other  counseling recommendations.  Return in about 2 weeks (around 12/11/2017).  Future Appointments  Date Time Provider Department Center  11/27/2017  9:15 AM Leftwich-Kirby, Wilmer FloorLisa A, CNM [facility]    Sharen CounterLisa Leftwich-Kirby, CNM

## 2017-11-27 NOTE — Patient Instructions (Signed)
Third Trimester of Pregnancy The third trimester is from week 28 through week 40 (months 7 through 9). The third trimester is a time when the unborn baby (fetus) is growing rapidly. At the end of the ninth month, the fetus is about 20 inches in length and weighs 6-10 pounds. Body changes during your third trimester Your body will continue to go through many changes during pregnancy. The changes vary from woman to woman. During the third trimester:  Your weight will continue to increase. You can expect to gain 25-35 pounds (11-16 kg) by the end of the pregnancy.  You may begin to get stretch marks on your hips, abdomen, and breasts.  You may urinate more often because the fetus is moving lower into your pelvis and pressing on your bladder.  You may develop or continue to have heartburn. This is caused by increased hormones that slow down muscles in the digestive tract.  You may develop or continue to have constipation because increased hormones slow digestion and cause the muscles that push waste through your intestines to relax.  You may develop hemorrhoids. These are swollen veins (varicose veins) in the rectum that can itch or be painful.  You may develop swollen, bulging veins (varicose veins) in your legs.  You may have increased body aches in the pelvis, back, or thighs. This is due to weight gain and increased hormones that are relaxing your joints.  You may have changes in your hair. These can include thickening of your hair, rapid growth, and changes in texture. Some women also have hair loss during or after pregnancy, or hair that feels dry or thin. Your hair will most likely return to normal after your baby is born.  Your breasts will continue to grow and they will continue to become tender. A yellow fluid (colostrum) may leak from your breasts. This is the first milk you are producing for your baby.  Your belly button may stick out.  You may notice more swelling in your hands,  face, or ankles.  You may have increased tingling or numbness in your hands, arms, and legs. The skin on your belly may also feel numb.  You may feel short of breath because of your expanding uterus.  You may have more problems sleeping. This can be caused by the size of your belly, increased need to urinate, and an increase in your body's metabolism.  You may notice the fetus "dropping," or moving lower in your abdomen (lightening).  You may have increased vaginal discharge.  You may notice your joints feel loose and you may have pain around your pelvic bone.  What to expect at prenatal visits You will have prenatal exams every 2 weeks until week 36. Then you will have weekly prenatal exams. During a routine prenatal visit:  You will be weighed to make sure you and the baby are growing normally.  Your blood pressure will be taken.  Your abdomen will be measured to track your baby's growth.  The fetal heartbeat will be listened to.  Any test results from the previous visit will be discussed.  You may have a cervical check near your due date to see if your cervix has softened or thinned (effaced).  You will be tested for Group B streptococcus. This happens between 35 and 37 weeks.  Your health care provider may ask you:  What your birth plan is.  How you are feeling.  If you are feeling the baby move.  If you have had   any abnormal symptoms, such as leaking fluid, bleeding, severe headaches, or abdominal cramping.  If you are using any tobacco products, including cigarettes, chewing tobacco, and electronic cigarettes.  If you have any questions.  Other tests or screenings that may be performed during your third trimester include:  Blood tests that check for low iron levels (anemia).  Fetal testing to check the health, activity level, and growth of the fetus. Testing is done if you have certain medical conditions or if there are problems during the  pregnancy.  Nonstress test (NST). This test checks the health of your baby to make sure there are no signs of problems, such as the baby not getting enough oxygen. During this test, a belt is placed around your belly. The baby is made to move, and its heart rate is monitored during movement.  What is false labor? False labor is a condition in which you feel small, irregular tightenings of the muscles in the womb (contractions) that usually go away with rest, changing position, or drinking water. These are called Braxton Hicks contractions. Contractions may last for hours, days, or even weeks before true labor sets in. If contractions come at regular intervals, become more frequent, increase in intensity, or become painful, you should see your health care provider. What are the signs of labor?  Abdominal cramps.  Regular contractions that start at 10 minutes apart and become stronger and more frequent with time.  Contractions that start on the top of the uterus and spread down to the lower abdomen and back.  Increased pelvic pressure and dull back pain.  A watery or bloody mucus discharge that comes from the vagina.  Leaking of amniotic fluid. This is also known as your "water breaking." It could be a slow trickle or a gush. Let your health care provider know if it has a color or strange odor. If you have any of these signs, call your health care provider right away, even if it is before your due date. Follow these instructions at home: Medicines  Follow your health care provider's instructions regarding medicine use. Specific medicines may be either safe or unsafe to take during pregnancy.  Take a prenatal vitamin that contains at least 600 micrograms (mcg) of folic acid.  If you develop constipation, try taking a stool softener if your health care provider approves. Eating and drinking  Eat a balanced diet that includes fresh fruits and vegetables, whole grains, good sources of protein  such as meat, eggs, or tofu, and low-fat dairy. Your health care provider will help you determine the amount of weight gain that is right for you.  Avoid raw meat and uncooked cheese. These carry germs that can cause birth defects in the baby.  If you have low calcium intake from food, talk to your health care provider about whether you should take a daily calcium supplement.  Eat four or five small meals rather than three large meals a day.  Limit foods that are high in fat and processed sugars, such as fried and sweet foods.  To prevent constipation: ? Drink enough fluid to keep your urine clear or pale yellow. ? Eat foods that are high in fiber, such as fresh fruits and vegetables, whole grains, and beans. Activity  Exercise only as directed by your health care provider. Most women can continue their usual exercise routine during pregnancy. Try to exercise for 30 minutes at least 5 days a week. Stop exercising if you experience uterine contractions.  Avoid heavy   lifting.  Do not exercise in extreme heat or humidity, or at high altitudes.  Wear low-heel, comfortable shoes.  Practice good posture.  You may continue to have sex unless your health care provider tells you otherwise. Relieving pain and discomfort  Take frequent breaks and rest with your legs elevated if you have leg cramps or low back pain.  Take warm sitz baths to soothe any pain or discomfort caused by hemorrhoids. Use hemorrhoid cream if your health care provider approves.  Wear a good support bra to prevent discomfort from breast tenderness.  If you develop varicose veins: ? Wear support pantyhose or compression stockings as told by your healthcare provider. ? Elevate your feet for 15 minutes, 3-4 times a day. Prenatal care  Write down your questions. Take them to your prenatal visits.  Keep all your prenatal visits as told by your health care provider. This is important. Safety  Wear your seat belt at  all times when driving.  Make a list of emergency phone numbers, including numbers for family, friends, the hospital, and police and fire departments. General instructions  Avoid cat litter boxes and soil used by cats. These carry germs that can cause birth defects in the baby. If you have a cat, ask someone to clean the litter box for you.  Do not travel far distances unless it is absolutely necessary and only with the approval of your health care provider.  Do not use hot tubs, steam rooms, or saunas.  Do not drink alcohol.  Do not use any products that contain nicotine or tobacco, such as cigarettes and e-cigarettes. If you need help quitting, ask your health care provider.  Do not use any medicinal herbs or unprescribed drugs. These chemicals affect the formation and growth of the baby.  Do not douche or use tampons or scented sanitary pads.  Do not cross your legs for long periods of time.  To prepare for the arrival of your baby: ? Take prenatal classes to understand, practice, and ask questions about labor and delivery. ? Make a trial run to the hospital. ? Visit the hospital and tour the maternity area. ? Arrange for maternity or paternity leave through employers. ? Arrange for family and friends to take care of pets while you are in the hospital. ? Purchase a rear-facing car seat and make sure you know how to install it in your car. ? Pack your hospital bag. ? Prepare the baby's nursery. Make sure to remove all pillows and stuffed animals from the baby's crib to prevent suffocation.  Visit your dentist if you have not gone during your pregnancy. Use a soft toothbrush to brush your teeth and be gentle when you floss. Contact a health care provider if:  You are unsure if you are in labor or if your water has broken.  You become dizzy.  You have mild pelvic cramps, pelvic pressure, or nagging pain in your abdominal area.  You have lower back pain.  You have persistent  nausea, vomiting, or diarrhea.  You have an unusual or bad smelling vaginal discharge.  You have pain when you urinate. Get help right away if:  Your water breaks before 37 weeks.  You have regular contractions less than 5 minutes apart before 37 weeks.  You have a fever.  You are leaking fluid from your vagina.  You have spotting or bleeding from your vagina.  You have severe abdominal pain or cramping.  You have rapid weight loss or weight gain.    You have shortness of breath with chest pain.  You notice sudden or extreme swelling of your face, hands, ankles, feet, or legs.  Your baby makes fewer than 10 movements in 2 hours.  You have severe headaches that do not go away when you take medicine.  You have vision changes. Summary  The third trimester is from week 28 through week 40, months 7 through 9. The third trimester is a time when the unborn baby (fetus) is growing rapidly.  During the third trimester, your discomfort may increase as you and your baby continue to gain weight. You may have abdominal, leg, and back pain, sleeping problems, and an increased need to urinate.  During the third trimester your breasts will keep growing and they will continue to become tender. A yellow fluid (colostrum) may leak from your breasts. This is the first milk you are producing for your baby.  False labor is a condition in which you feel small, irregular tightenings of the muscles in the womb (contractions) that eventually go away. These are called Braxton Hicks contractions. Contractions may last for hours, days, or even weeks before true labor sets in.  Signs of labor can include: abdominal cramps; regular contractions that start at 10 minutes apart and become stronger and more frequent with time; watery or bloody mucus discharge that comes from the vagina; increased pelvic pressure and dull back pain; and leaking of amniotic fluid. This information is not intended to replace advice  given to you by your health care provider. Make sure you discuss any questions you have with your health care provider. Document Released: 03/13/2001 Document Revised: 08/25/2015 Document Reviewed: 05/20/2012 Elsevier Interactive Patient Education  2017 [employer]  

## 2017-11-27 NOTE — Addendum Note (Signed)
Addended by: Henrietta DineNEAL, Gearld Kerstein S on: 11/27/2017 10:07 AM   Modules accepted: Orders

## 2017-11-28 LAB — CBC
Hematocrit: 34.3 % (ref 34.0–46.6)
Hemoglobin: 11.5 g/dL (ref 11.1–15.9)
MCH: 31.3 pg (ref 26.6–33.0)
MCHC: 33.5 g/dL (ref 31.5–35.7)
MCV: 93 fL (ref 79–97)
Platelets: 237 10*3/uL (ref 150–450)
RBC: 3.68 x10E6/uL — ABNORMAL LOW (ref 3.77–5.28)
RDW: 12.7 % (ref 12.3–15.4)
WBC: 7.2 10*3/uL (ref 3.4–10.8)

## 2017-11-28 LAB — HIV ANTIBODY (ROUTINE TESTING W REFLEX): HIV SCREEN 4TH GENERATION: NONREACTIVE

## 2017-11-28 LAB — RPR: RPR Ser Ql: NONREACTIVE

## 2017-11-28 LAB — GLUCOSE TOLERANCE, 2 HOURS W/ 1HR
GLUCOSE, FASTING: 67 mg/dL (ref 65–91)
Glucose, 1 hour: 150 mg/dL (ref 65–179)
Glucose, 2 hour: 92 mg/dL (ref 65–152)

## 2017-12-10 ENCOUNTER — Telehealth: Payer: Self-pay | Admitting: Family Medicine

## 2017-12-10 NOTE — Addendum Note (Signed)
Addended by: Henrietta Dine on: 12/10/2017 02:54 PM   Modules accepted: Orders

## 2017-12-10 NOTE — Telephone Encounter (Signed)
[employer] , Detailed message was left about about change it is now Friday 9-13 @2 :15pm

## 2017-12-11 ENCOUNTER — Encounter: Payer: Self-pay | Admitting: Student

## 2017-12-13 ENCOUNTER — Ambulatory Visit (INDEPENDENT_AMBULATORY_CARE_PROVIDER_SITE_OTHER): Payer: Medicaid Other | Admitting: Nurse Practitioner

## 2017-12-13 DIAGNOSIS — Z23 Encounter for immunization: Secondary | ICD-10-CM | POA: Diagnosis not present

## 2017-12-13 DIAGNOSIS — Z3400 Encounter for supervision of normal first pregnancy, unspecified trimester: Secondary | ICD-10-CM

## 2017-12-13 DIAGNOSIS — Z3403 Encounter for supervision of normal first pregnancy, third trimester: Secondary | ICD-10-CM

## 2017-12-13 NOTE — Progress Notes (Signed)
    Subjective:  Kim Blanchard is a 22 y.o. G1P0 at 5976w5d being seen today for ongoing prenatal care.  [name] for the following issues for this low-risk pregnancy and has Supervision of normal first pregnancy, antepartum and Language barrier affecting health care on their problem list. Interpreter present for entire visit today.  Patient reports no complaints.  Contractions: Not present. Vag. Bleeding: None.  Movement: Present. Denies leaking of fluid.   The following portions of the patient's history were reviewed and updated as appropriate: allergies, current medications, past family history, past medical history, past social history, past surgical history and problem list. Problem list updated.  Objective:   Vitals:   12/13/17 1427  BP: 110/64  Pulse: 72  Weight: 152 lb 14.4 oz (69.4 kg)    Fetal Status: Fetal Heart Rate (bpm): 135 Fundal Height: 31 cm Movement: Present     General:  Alert, oriented and cooperative. Patient is in no acute distress.  Skin: Skin is warm and dry. No rash noted.   Cardiovascular: Normal heart rate noted  Respiratory: Normal respiratory effort, no problems with respiration noted  Abdomen: Soft, gravid, appropriate for gestational age. Pain/Pressure: Absent     Pelvic:  Cervical exam deferred        Extremities: Normal range of motion.  Edema: None  Mental Status: Normal mood and affect. Normal behavior. Normal judgment and thought content.   Urinalysis:      Assessment and Plan:  Pregnancy: G1P0 at 5676w5d  1. Supervision of normal first pregnancy, antepartum Will check on Medicaid next week - applied but has not heard back to see if she was accepted or denied.  - Flu Vaccine QUAD 36+ mos IM  Preterm labor symptoms and general obstetric precautions including but not limited to vaginal bleeding, contractions, leaking of fluid and fetal movement were reviewed in detail with the patient. Please refer to After Visit Summary for  other counseling recommendations.  Return in about 2 weeks (around 12/27/2017).  Nolene BernheimERRI Rowdy Guerrini, RN, MSN, NP-BC Nurse Practitioner, [facility], [facility] 12/13/2017 2:48 PM

## 2017-12-30 ENCOUNTER — Encounter: Payer: Self-pay | Admitting: Advanced Practice Midwife

## 2017-12-30 ENCOUNTER — Ambulatory Visit (INDEPENDENT_AMBULATORY_CARE_PROVIDER_SITE_OTHER): Payer: Self-pay | Admitting: Advanced Practice Midwife

## 2017-12-30 VITALS — BP 120/69 | HR 84 | Wt 153.0 lb

## 2017-12-30 DIAGNOSIS — Z3400 Encounter for supervision of normal first pregnancy, unspecified trimester: Secondary | ICD-10-CM

## 2017-12-30 NOTE — Patient Instructions (Signed)

## 2017-12-30 NOTE — Progress Notes (Signed)
   PRENATAL VISIT NOTE  Subjective:  Kim Blanchard is a 22 y.o. G1P0 at [redacted]w[redacted]d being seen today for ongoing prenatal care.  [name] for the following issues for this low-risk pregnancy and has Supervision of normal first pregnancy, antepartum and Language barrier affecting health care on their problem list.  Patient reports no complaints.  Contractions: Not present. Vag. Bleeding: None.  Movement: Present. Denies leaking of fluid.   The following portions of the patient's history were reviewed and updated as appropriate: allergies, current medications, past family history, past medical history, past social history, past surgical history and problem list. Problem list updated.  Objective:   Vitals:   12/30/17 0938  BP: 120/69  Pulse: 84  Weight: 153 lb (69.4 kg)    Fetal Status: Fetal Heart Rate (bpm): 157 Fundal Height: 33 cm Movement: Present     General:  Alert, oriented and cooperative. Patient is in no acute distress.  Skin: Skin is warm and dry. No rash noted.   Cardiovascular: Normal heart rate noted  Respiratory: Normal respiratory effort, no problems with respiration noted  Abdomen: Soft, gravid, appropriate for gestational age.  Pain/Pressure: Absent     Pelvic: Cervical exam deferred        Extremities: Normal range of motion.  Edema: None  Mental Status: Normal mood and affect. Normal behavior. Normal judgment and thought content.   Assessment and Plan:  Pregnancy: G1P0 at [redacted]w[redacted]d  1. Supervision of normal first pregnancy, antepartum - Routine care - Vertex by leopolds today   Preterm labor symptoms and general obstetric precautions including but not limited to vaginal bleeding, contractions, leaking of fluid and fetal movement were reviewed in detail with the patient. Please refer to After Visit Summary for other counseling recommendations.  Return in about 2 weeks (around 01/13/2018).  No future appointments.  Thressa Sheller, CNM

## 2018-01-14 ENCOUNTER — Ambulatory Visit (INDEPENDENT_AMBULATORY_CARE_PROVIDER_SITE_OTHER): Payer: Medicaid Other | Admitting: Advanced Practice Midwife

## 2018-01-14 VITALS — BP 116/83 | HR 98 | Wt 158.3 lb

## 2018-01-14 DIAGNOSIS — Z789 Other specified health status: Secondary | ICD-10-CM

## 2018-01-14 DIAGNOSIS — Z3400 Encounter for supervision of normal first pregnancy, unspecified trimester: Secondary | ICD-10-CM

## 2018-01-14 DIAGNOSIS — R109 Unspecified abdominal pain: Secondary | ICD-10-CM

## 2018-01-14 DIAGNOSIS — O26893 Other specified pregnancy related conditions, third trimester: Secondary | ICD-10-CM

## 2018-01-14 NOTE — Progress Notes (Signed)
   PRENATAL VISIT NOTE  Subjective:  Kim Blanchard is a 22 y.o. G1P0 at [redacted]w[redacted]d being seen today for ongoing prenatal care.  [name] for the following issues for this low-risk pregnancy and has Supervision of normal first pregnancy, antepartum and Language barrier affecting health care on their problem list.  Patient reports occasional contractions.  Contractions: Not present. Vag. Bleeding: None.  Movement: Present. Denies leaking of fluid.   The following portions of the patient's history were reviewed and updated as appropriate: allergies, current medications, past family history, past medical history, past social history, past surgical history and problem list. Problem list updated.  Objective:   Vitals:   01/14/18 1353  BP: 116/83  Pulse: 98  Weight: 71.8 kg    Fetal Status: Fetal Heart Rate (bpm): 154   Movement: Present     General:  Alert, oriented and cooperative. Patient is in no acute distress.  Skin: Skin is warm and dry. No rash noted.   Cardiovascular: Normal heart rate noted  Respiratory: Normal respiratory effort, no problems with respiration noted  Abdomen: Soft, gravid, appropriate for gestational age.  Pain/Pressure: Absent     Pelvic: Cervical exam deferred        Extremities: Normal range of motion.  Edema: None  Mental Status: Normal mood and affect. Normal behavior. Normal judgment and thought content.   Assessment and Plan:  Pregnancy: G1P0 at [redacted]w[redacted]d  1. Supervision of normal first pregnancy, antepartum --Anticipatory guidance about next visits/weeks of pregnancy given.   2. Language barrier affecting health care --Language line with Kiny[place] interpreter  used for all communication.   3. Abdominal pain during pregnancy, third trimester --Pain is 2-3 times daily, stomach hardens "like a stone".  Pt just wants to make sure this is Ok for baby.  --Cervix closed, no evidence of labor --Likely braxton-hicks contractions --Preterm  labor signs reviewed --Increase PO fluids, rest PRN  Preterm labor symptoms and general obstetric precautions including but not limited to vaginal bleeding, contractions, leaking of fluid and fetal movement were reviewed in detail with the patient. Please refer to After Visit Summary for other counseling recommendations.  Return in about 2 weeks (around 01/28/2018).  Future Appointments  Date Time Provider Department Center  01/29/2018  1:55 PM Tamera Stands, DO [facility]    Sharen Counter, CNM

## 2018-01-14 NOTE — Patient Instructions (Signed)
Third Trimester of Pregnancy The third trimester is from week 28 through week 40 (months 7 through 9). The third trimester is a time when the unborn baby (fetus) is growing rapidly. At the end of the ninth month, the fetus is about 20 inches in length and weighs 6-10 pounds. Body changes during your third trimester Your body will continue to go through many changes during pregnancy. The changes vary from woman to woman. During the third trimester:  Your weight will continue to increase. You can expect to gain 25-35 pounds (11-16 kg) by the end of the pregnancy.  You may begin to get stretch marks on your hips, abdomen, and breasts.  You may urinate more often because the fetus is moving lower into your pelvis and pressing on your bladder.  You may develop or continue to have heartburn. This is caused by increased hormones that slow down muscles in the digestive tract.  You may develop or continue to have constipation because increased hormones slow digestion and cause the muscles that push waste through your intestines to relax.  You may develop hemorrhoids. These are swollen veins (varicose veins) in the rectum that can itch or be painful.  You may develop swollen, bulging veins (varicose veins) in your legs.  You may have increased body aches in the pelvis, back, or thighs. This is due to weight gain and increased hormones that are relaxing your joints.  You may have changes in your hair. These can include thickening of your hair, rapid growth, and changes in texture. Some women also have hair loss during or after pregnancy, or hair that feels dry or thin. Your hair will most likely return to normal after your baby is born.  Your breasts will continue to grow and they will continue to become tender. A yellow fluid (colostrum) may leak from your breasts. This is the first milk you are producing for your baby.  Your belly button may stick out.  You may notice more swelling in your hands,  face, or ankles.  You may have increased tingling or numbness in your hands, arms, and legs. The skin on your belly may also feel numb.  You may feel short of breath because of your expanding uterus.  You may have more problems sleeping. This can be caused by the size of your belly, increased need to urinate, and an increase in your body's metabolism.  You may notice the fetus "dropping," or moving lower in your abdomen (lightening).  You may have increased vaginal discharge.  You may notice your joints feel loose and you may have pain around your pelvic bone.  What to expect at prenatal visits You will have prenatal exams every 2 weeks until week 36. Then you will have weekly prenatal exams. During a routine prenatal visit:  You will be weighed to make sure you and the baby are growing normally.  Your blood pressure will be taken.  Your abdomen will be measured to track your baby's growth.  The fetal heartbeat will be listened to.  Any test results from the previous visit will be discussed.  You may have a cervical check near your due date to see if your cervix has softened or thinned (effaced).  You will be tested for Group B streptococcus. This happens between 35 and 37 weeks.  Your health care provider may ask you:  What your birth plan is.  How you are feeling.  If you are feeling the baby move.  If you have had   any abnormal symptoms, such as leaking fluid, bleeding, severe headaches, or abdominal cramping.  If you are using any tobacco products, including cigarettes, chewing tobacco, and electronic cigarettes.  If you have any questions.  Other tests or screenings that may be performed during your third trimester include:  Blood tests that check for low iron levels (anemia).  Fetal testing to check the health, activity level, and growth of the fetus. Testing is done if you have certain medical conditions or if there are problems during the  pregnancy.  Nonstress test (NST). This test checks the health of your baby to make sure there are no signs of problems, such as the baby not getting enough oxygen. During this test, a belt is placed around your belly. The baby is made to move, and its heart rate is monitored during movement.  What is false labor? False labor is a condition in which you feel small, irregular tightenings of the muscles in the womb (contractions) that usually go away with rest, changing position, or drinking water. These are called Braxton Hicks contractions. Contractions may last for hours, days, or even weeks before true labor sets in. If contractions come at regular intervals, become more frequent, increase in intensity, or become painful, you should see your health care provider. What are the signs of labor?  Abdominal cramps.  Regular contractions that start at 10 minutes apart and become stronger and more frequent with time.  Contractions that start on the top of the uterus and spread down to the lower abdomen and back.  Increased pelvic pressure and dull back pain.  A watery or bloody mucus discharge that comes from the vagina.  Leaking of amniotic fluid. This is also known as your "water breaking." It could be a slow trickle or a gush. Let your health care provider know if it has a color or strange odor. If you have any of these signs, call your health care provider right away, even if it is before your due date. Follow these instructions at home: Medicines  Follow your health care provider's instructions regarding medicine use. Specific medicines may be either safe or unsafe to take during pregnancy.  Take a prenatal vitamin that contains at least 600 micrograms (mcg) of folic acid.  If you develop constipation, try taking a stool softener if your health care provider approves. Eating and drinking  Eat a balanced diet that includes fresh fruits and vegetables, whole grains, good sources of protein  such as meat, eggs, or tofu, and low-fat dairy. Your health care provider will help you determine the amount of weight gain that is right for you.  Avoid raw meat and uncooked cheese. These carry germs that can cause birth defects in the baby.  If you have low calcium intake from food, talk to your health care provider about whether you should take a daily calcium supplement.  Eat four or five small meals rather than three large meals a day.  Limit foods that are high in fat and processed sugars, such as fried and sweet foods.  To prevent constipation: ? Drink enough fluid to keep your urine clear or pale yellow. ? Eat foods that are high in fiber, such as fresh fruits and vegetables, whole grains, and beans. Activity  Exercise only as directed by your health care provider. Most women can continue their usual exercise routine during pregnancy. Try to exercise for 30 minutes at least 5 days a week. Stop exercising if you experience uterine contractions.  Avoid heavy   lifting.  Do not exercise in extreme heat or humidity, or at high altitudes.  Wear low-heel, comfortable shoes.  Practice good posture.  You may continue to have sex unless your health care provider tells you otherwise. Relieving pain and discomfort  Take frequent breaks and rest with your legs elevated if you have leg cramps or low back pain.  Take warm sitz baths to soothe any pain or discomfort caused by hemorrhoids. Use hemorrhoid cream if your health care provider approves.  Wear a good support bra to prevent discomfort from breast tenderness.  If you develop varicose veins: ? Wear support pantyhose or compression stockings as told by your healthcare provider. ? Elevate your feet for 15 minutes, 3-4 times a day. Prenatal care  Write down your questions. Take them to your prenatal visits.  Keep all your prenatal visits as told by your health care provider. This is important. Safety  Wear your seat belt at  all times when driving.  Make a list of emergency phone numbers, including numbers for family, friends, the hospital, and police and fire departments. General instructions  Avoid cat litter boxes and soil used by cats. These carry germs that can cause birth defects in the baby. If you have a cat, ask someone to clean the litter box for you.  Do not travel far distances unless it is absolutely necessary and only with the approval of your health care provider.  Do not use hot tubs, steam rooms, or saunas.  Do not drink alcohol.  Do not use any products that contain nicotine or tobacco, such as cigarettes and e-cigarettes. If you need help quitting, ask your health care provider.  Do not use any medicinal herbs or unprescribed drugs. These chemicals affect the formation and growth of the baby.  Do not douche or use tampons or scented sanitary pads.  Do not cross your legs for long periods of time.  To prepare for the arrival of your baby: ? Take prenatal classes to understand, practice, and ask questions about labor and delivery. ? Make a trial run to the hospital. ? Visit the hospital and tour the maternity area. ? Arrange for maternity or paternity leave through employers. ? Arrange for family and friends to take care of pets while you are in the hospital. ? Purchase a rear-facing car seat and make sure you know how to install it in your car. ? Pack your hospital bag. ? Prepare the baby's nursery. Make sure to remove all pillows and stuffed animals from the baby's crib to prevent suffocation.  Visit your dentist if you have not gone during your pregnancy. Use a soft toothbrush to brush your teeth and be gentle when you floss. Contact a health care provider if:  You are unsure if you are in labor or if your water has broken.  You become dizzy.  You have mild pelvic cramps, pelvic pressure, or nagging pain in your abdominal area.  You have lower back pain.  You have persistent  nausea, vomiting, or diarrhea.  You have an unusual or bad smelling vaginal discharge.  You have pain when you urinate. Get help right away if:  Your water breaks before 37 weeks.  You have regular contractions less than 5 minutes apart before 37 weeks.  You have a fever.  You are leaking fluid from your vagina.  You have spotting or bleeding from your vagina.  You have severe abdominal pain or cramping.  You have rapid weight loss or weight gain.    You have shortness of breath with chest pain.  You notice sudden or extreme swelling of your face, hands, ankles, feet, or legs.  Your baby makes fewer than 10 movements in 2 hours.  You have severe headaches that do not go away when you take medicine.  You have vision changes. Summary  The third trimester is from week 28 through week 40, months 7 through 9. The third trimester is a time when the unborn baby (fetus) is growing rapidly.  During the third trimester, your discomfort may increase as you and your baby continue to gain weight. You may have abdominal, leg, and back pain, sleeping problems, and an increased need to urinate.  During the third trimester your breasts will keep growing and they will continue to become tender. A yellow fluid (colostrum) may leak from your breasts. This is the first milk you are producing for your baby.  False labor is a condition in which you feel small, irregular tightenings of the muscles in the womb (contractions) that eventually go away. These are called Braxton Hicks contractions. Contractions may last for hours, days, or even weeks before true labor sets in.  Signs of labor can include: abdominal cramps; regular contractions that start at 10 minutes apart and become stronger and more frequent with time; watery or bloody mucus discharge that comes from the vagina; increased pelvic pressure and dull back pain; and leaking of amniotic fluid. This information is not intended to replace advice  given to you by your health care provider. Make sure you discuss any questions you have with your health care provider. Document Released: 03/13/2001 Document Revised: 08/25/2015 Document Reviewed: 05/20/2012 Elsevier Interactive Patient Education  2017 [employer]  

## 2018-01-17 ENCOUNTER — Inpatient Hospital Stay (HOSPITAL_COMMUNITY)
Admission: AD | Admit: 2018-01-17 | Discharge: 2018-01-20 | DRG: 807 | Disposition: A | Discharge status: Home / Self Care | Payer: Medicaid Other | Attending: Obstetrics and Gynecology | Admitting: Obstetrics and Gynecology

## 2018-01-17 ENCOUNTER — Encounter (HOSPITAL_COMMUNITY): Payer: Self-pay

## 2018-01-17 DIAGNOSIS — O42913 Preterm premature rupture of membranes, unspecified as to length of time between rupture and onset of labor, third trimester: Principal | ICD-10-CM | POA: Diagnosis present

## 2018-01-17 DIAGNOSIS — Z3A34 34 weeks gestation of pregnancy: Secondary | ICD-10-CM

## 2018-01-17 DIAGNOSIS — Z3400 Encounter for supervision of normal first pregnancy, unspecified trimester: Secondary | ICD-10-CM

## 2018-01-17 LAB — URINALYSIS, ROUTINE W REFLEX MICROSCOPIC
BILIRUBIN URINE: NEGATIVE
Glucose, UA: NEGATIVE mg/dL
Hgb urine dipstick: NEGATIVE
KETONES UR: NEGATIVE mg/dL
Leukocytes, UA: NEGATIVE
NITRITE: NEGATIVE
PH: 8 (ref 5.0–8.0)
Protein, ur: NEGATIVE mg/dL
SPECIFIC GRAVITY, URINE: 1.004 — AB (ref 1.005–1.030)

## 2018-01-17 MED ORDER — BETAMETHASONE SOD PHOS & ACET 6 (3-3) MG/ML IJ SUSP
12.0000 mg | INTRAMUSCULAR | Status: DC
  Administered 2018-01-18: 12 mg via INTRAMUSCULAR
  Filled 2018-01-17 (×2): qty 2

## 2018-01-17 NOTE — MAU Note (Signed)
Leaking clear fld since yesterday at 0500 and decreased FM since yesterday. NO pain

## 2018-01-18 ENCOUNTER — Inpatient Hospital Stay (HOSPITAL_COMMUNITY): Payer: Medicaid Other | Admitting: Anesthesiology

## 2018-01-18 ENCOUNTER — Encounter (HOSPITAL_COMMUNITY): Payer: Self-pay

## 2018-01-18 ENCOUNTER — Other Ambulatory Visit: Payer: Self-pay

## 2018-01-18 DIAGNOSIS — O42913 Preterm premature rupture of membranes, unspecified as to length of time between rupture and onset of labor, third trimester: Secondary | ICD-10-CM | POA: Diagnosis present

## 2018-01-18 DIAGNOSIS — O42013 Preterm premature rupture of membranes, onset of labor within 24 hours of rupture, third trimester: Secondary | ICD-10-CM

## 2018-01-18 DIAGNOSIS — Z3A34 34 weeks gestation of pregnancy: Secondary | ICD-10-CM | POA: Diagnosis not present

## 2018-01-18 LAB — CBC
HCT: 34 % — ABNORMAL LOW (ref 36.0–46.0)
HEMOGLOBIN: 12 g/dL (ref 12.0–15.0)
MCH: 30.7 pg (ref 26.0–34.0)
MCHC: 35.3 g/dL (ref 30.0–36.0)
MCV: 87 fL (ref 80.0–100.0)
NRBC: 0 % (ref 0.0–0.2)
PLATELETS: 205 10*3/uL (ref 150–400)
RBC: 3.91 MIL/uL (ref 3.87–5.11)
RDW: 12.8 % (ref 11.5–15.5)
WBC: 7 10*3/uL (ref 4.0–10.5)

## 2018-01-18 LAB — RPR: RPR: NONREACTIVE

## 2018-01-18 LAB — TYPE AND SCREEN
ABO/RH(D): O POS
ANTIBODY SCREEN: NEGATIVE

## 2018-01-18 LAB — ABO/RH: ABO/RH(D): O POS

## 2018-01-18 LAB — POCT FERN TEST: POCT FERN TEST: POSITIVE

## 2018-01-18 MED ORDER — MEASLES, MUMPS & RUBELLA VAC ~~LOC~~ INJ
0.5000 mL | INJECTION | Freq: Once | SUBCUTANEOUS | Status: DC
  Filled 2018-01-18: qty 0.5

## 2018-01-18 MED ORDER — BENZOCAINE-MENTHOL 20-0.5 % EX AERO: 1.0000 "application " | INHALATION_SPRAY | CUTANEOUS | Status: DC | PRN

## 2018-01-18 MED ORDER — EPHEDRINE 5 MG/ML INJ: 10.0000 mg | INTRAVENOUS | Status: DC | PRN

## 2018-01-18 MED ORDER — TERBUTALINE SULFATE 1 MG/ML IJ SOLN: 0.2500 mg | Freq: Once | INTRAMUSCULAR | Status: DC | PRN

## 2018-01-18 MED ORDER — LACTATED RINGERS IV SOLN
500.0000 mL | Freq: Once | INTRAVENOUS | Status: AC
  Administered 2018-01-18: 500 mL via INTRAVENOUS

## 2018-01-18 MED ORDER — EPHEDRINE 5 MG/ML INJ
10.0000 mg | INTRAVENOUS | Status: DC | PRN
  Filled 2018-01-18: qty 2

## 2018-01-18 MED ORDER — OXYTOCIN 40 UNITS IN LACTATED RINGERS INFUSION - SIMPLE MED
1.0000 m[IU]/min | INTRAVENOUS | Status: DC
  Administered 2018-01-18: 2 m[IU]/min via INTRAVENOUS
  Filled 2018-01-18: qty 1000

## 2018-01-18 MED ORDER — COCONUT OIL OIL
1.0000 "application " | TOPICAL_OIL | Status: DC | PRN
  Filled 2018-01-18: qty 120

## 2018-01-18 MED ORDER — FLEET ENEMA 7-19 GM/118ML RE ENEM: 1.0000 | ENEMA | RECTAL | Status: DC | PRN

## 2018-01-18 MED ORDER — IBUPROFEN 600 MG PO TABS
600.0000 mg | ORAL_TABLET | Freq: Four times a day (QID) | ORAL | Status: DC
  Administered 2018-01-19 – 2018-01-20 (×6): 600 mg via ORAL
  Filled 2018-01-18 (×7): qty 1

## 2018-01-18 MED ORDER — ONDANSETRON HCL 4 MG/2ML IJ SOLN: 4.0000 mg | Freq: Four times a day (QID) | INTRAMUSCULAR | Status: DC | PRN

## 2018-01-18 MED ORDER — OXYTOCIN BOLUS FROM INFUSION
500.0000 mL | Freq: Once | INTRAVENOUS | Status: AC
  Administered 2018-01-18: 500 mL via INTRAVENOUS

## 2018-01-18 MED ORDER — PHENYLEPHRINE 40 MCG/ML (10ML) SYRINGE FOR IV PUSH (FOR BLOOD PRESSURE SUPPORT): 80.0000 ug | PREFILLED_SYRINGE | INTRAVENOUS | Status: DC | PRN

## 2018-01-18 MED ORDER — LIDOCAINE HCL (PF) 1 % IJ SOLN
INTRAMUSCULAR | Status: DC | PRN
  Administered 2018-01-18: 2 mL via EPIDURAL
  Administered 2018-01-18: 3 mL via EPIDURAL
  Administered 2018-01-18: 5 mL via EPIDURAL

## 2018-01-18 MED ORDER — PENICILLIN G 3 MILLION UNITS IVPB - SIMPLE MED
3.0000 10*6.[IU] | INTRAVENOUS | Status: DC
  Administered 2018-01-18 (×4): 3 10*6.[IU] via INTRAVENOUS
  Filled 2018-01-18 (×2): qty 100

## 2018-01-18 MED ORDER — PHENYLEPHRINE 40 MCG/ML (10ML) SYRINGE FOR IV PUSH (FOR BLOOD PRESSURE SUPPORT)
80.0000 ug | PREFILLED_SYRINGE | INTRAVENOUS | Status: DC | PRN
  Filled 2018-01-18: qty 5

## 2018-01-18 MED ORDER — OXYCODONE-ACETAMINOPHEN 5-325 MG PO TABS: 1.0000 | ORAL_TABLET | ORAL | Status: DC | PRN

## 2018-01-18 MED ORDER — SENNOSIDES-DOCUSATE SODIUM 8.6-50 MG PO TABS
2.0000 | ORAL_TABLET | ORAL | Status: DC
  Administered 2018-01-19 (×2): 2 via ORAL
  Filled 2018-01-18 (×2): qty 2

## 2018-01-18 MED ORDER — FENTANYL CITRATE (PF) 100 MCG/2ML IJ SOLN
100.0000 ug | INTRAMUSCULAR | Status: DC | PRN
  Administered 2018-01-18 (×5): 100 ug via INTRAVENOUS
  Filled 2018-01-18 (×5): qty 2

## 2018-01-18 MED ORDER — ONDANSETRON HCL 4 MG PO TABS: 4.0000 mg | ORAL_TABLET | ORAL | Status: DC | PRN

## 2018-01-18 MED ORDER — DIPHENHYDRAMINE HCL 25 MG PO CAPS: 25.0000 mg | ORAL_CAPSULE | Freq: Four times a day (QID) | ORAL | Status: DC | PRN

## 2018-01-18 MED ORDER — DIBUCAINE 1 % RE OINT: 1.0000 "application " | TOPICAL_OINTMENT | RECTAL | Status: DC | PRN

## 2018-01-18 MED ORDER — ONDANSETRON HCL 4 MG/2ML IJ SOLN: 4.0000 mg | INTRAMUSCULAR | Status: DC | PRN

## 2018-01-18 MED ORDER — FENTANYL 2.5 MCG/ML BUPIVACAINE 1/10 % EPIDURAL INFUSION (WH - ANES)
14.0000 mL/h | INTRAMUSCULAR | Status: DC | PRN
  Administered 2018-01-18: 14 mL/h via EPIDURAL
  Filled 2018-01-18: qty 100

## 2018-01-18 MED ORDER — ACETAMINOPHEN 325 MG PO TABS: 650.0000 mg | ORAL_TABLET | ORAL | Status: DC | PRN

## 2018-01-18 MED ORDER — PRENATAL MULTIVITAMIN CH
1.0000 | ORAL_TABLET | Freq: Every day | ORAL | Status: DC
  Administered 2018-01-19 – 2018-01-20 (×2): 1 via ORAL
  Filled 2018-01-18 (×2): qty 1

## 2018-01-18 MED ORDER — OXYTOCIN 40 UNITS IN LACTATED RINGERS INFUSION - SIMPLE MED: 2.5000 [IU]/h | INTRAVENOUS | Status: DC

## 2018-01-18 MED ORDER — OXYCODONE-ACETAMINOPHEN 5-325 MG PO TABS: 2.0000 | ORAL_TABLET | ORAL | Status: DC | PRN

## 2018-01-18 MED ORDER — LACTATED RINGERS IV SOLN: 500.0000 mL | Freq: Once | INTRAVENOUS | Status: DC

## 2018-01-18 MED ORDER — TETANUS-DIPHTH-ACELL PERTUSSIS 5-2.5-18.5 LF-MCG/0.5 IM SUSP: 0.5000 mL | Freq: Once | INTRAMUSCULAR | Status: DC

## 2018-01-18 MED ORDER — LIDOCAINE HCL (PF) 1 % IJ SOLN
30.0000 mL | INTRAMUSCULAR | Status: DC | PRN
  Filled 2018-01-18: qty 30

## 2018-01-18 MED ORDER — SOD CITRATE-CITRIC ACID 500-334 MG/5ML PO SOLN: 30.0000 mL | ORAL | Status: DC | PRN

## 2018-01-18 MED ORDER — PHENYLEPHRINE 40 MCG/ML (10ML) SYRINGE FOR IV PUSH (FOR BLOOD PRESSURE SUPPORT)
80.0000 ug | PREFILLED_SYRINGE | INTRAVENOUS | Status: DC | PRN
  Filled 2018-01-18: qty 5
  Filled 2018-01-18: qty 10

## 2018-01-18 MED ORDER — MISOPROSTOL 50MCG HALF TABLET
50.0000 ug | ORAL_TABLET | ORAL | Status: DC | PRN
  Filled 2018-01-18: qty 1

## 2018-01-18 MED ORDER — ZOLPIDEM TARTRATE 5 MG PO TABS: 5.0000 mg | ORAL_TABLET | Freq: Every evening | ORAL | Status: DC | PRN

## 2018-01-18 MED ORDER — DIPHENHYDRAMINE HCL 50 MG/ML IJ SOLN: 12.5000 mg | INTRAMUSCULAR | Status: DC | PRN

## 2018-01-18 MED ORDER — LACTATED RINGERS IV SOLN
500.0000 mL | INTRAVENOUS | Status: DC | PRN
  Administered 2018-01-18 (×2): 500 mL via INTRAVENOUS

## 2018-01-18 MED ORDER — WITCH HAZEL-GLYCERIN EX PADS: 1.0000 "application " | MEDICATED_PAD | CUTANEOUS | Status: DC | PRN

## 2018-01-18 MED ORDER — SODIUM CHLORIDE 0.9 % IV SOLN
5.0000 10*6.[IU] | Freq: Once | INTRAVENOUS | Status: AC
  Administered 2018-01-18: 5 10*6.[IU] via INTRAVENOUS
  Filled 2018-01-18: qty 5

## 2018-01-18 MED ORDER — SIMETHICONE 80 MG PO CHEW: 80.0000 mg | CHEWABLE_TABLET | ORAL | Status: DC | PRN

## 2018-01-18 MED ORDER — LACTATED RINGERS IV SOLN
INTRAVENOUS | Status: DC
  Administered 2018-01-18 (×3): via INTRAVENOUS

## 2018-01-18 NOTE — Progress Notes (Signed)
Attempt to reach interpreter via ipad language interpreters.  They were unable to locate an interpreter that speaks [place] and requested I call back in 15 min.

## 2018-01-18 NOTE — H&P (Addendum)
LABOR AND DELIVERY ADMISSION HISTORY AND PHYSICAL NOTE  Kim Blanchard is a 22 y.o. female G1P0 with IUP at [redacted]w[redacted]d by LMP presenting for leakage of fluid. States she noted leaking yesterday morning and has been consistent since this time. ROM confirmed in MAU, clear fluid pooling in vaginal vault. Feeling some contractions. She reports positive fetal movement, however slightly less than usual for the past day. Denies vaginal bleeding.   History limited by language barrier. Patient speaks [place] and minimal [place]. Attempted to use phone translation, unable to take our request. Language not present on ipad translator. No family or friends at bedside.    Prenatal History/Complications: PNC at [facility].  Pregnancy complications: uncomplicated pregnancy course.   Past Medical History: History reviewed. No pertinent past medical history.  Past Surgical History: History reviewed. No pertinent surgical history.  Obstetrical History: OB History    Gravida  1   Para      Term      Preterm      AB      Living        SAB      TAB      Ectopic      Multiple      Live Births              Social History: Social History   Socioeconomic History  . Marital status: Single    Spouse name: Not on file  . Number of children: Not on file  . Years of education: Not on file  . Highest education level: Not on file  Occupational History  . Not on file  Social Needs  . Financial resource strain: Not on file  . Food insecurity:    Worry: Patient refused    Inability: Patient refused  . Transportation needs:    Medical: Patient refused    Non-medical: Patient refused  Tobacco Use  . Smoking status: Never Smoker  . Smokeless tobacco: Never Used  Substance and Sexual Activity  . Alcohol use: Never    Frequency: Never  . Drug use: Never  . Sexual activity: Yes    Birth control/protection: None  Lifestyle  . Physical activity:    Days per week: Patient refused    Minutes  per session: Patient refused  . Stress: Not on file  Relationships  . Social connections:    Talks on phone: Patient refused    Gets together: Patient refused    Attends religious service: Patient refused    Active member of club or organization: Patient refused    Attends meetings of clubs or organizations: Patient refused    Relationship status: Patient refused  Other Topics Concern  . Not on file  Social History Narrative  . Not on file    Family History: History reviewed. No pertinent family history.  Allergies: No Known Allergies  Medications Prior to Admission  Medication Sig Dispense Refill Last Dose  . Prenatal Vit-Fe Fumarate-FA (PRENATAL VITAMINS) 28-0.8 MG TABS Take 1 tablet by mouth daily. 60 tablet 3 01/17/2018 at Unknown time     Review of Systems  All systems reviewed and negative except as stated in HPI  Physical Exam Blood pressure 123/75, pulse 71, temperature 98.1 F (36.7 C), temperature source Oral, resp. rate 18, height 5\' 1"  (1.549 m), weight 72.1 kg, last menstrual period 05/19/2017. General appearance: alert, oriented, NAD Lungs: normal respiratory effort Heart: regular rate Abdomen: soft, non-tender; gravid, FH appropriate for GA Extremities: No calf swelling or tenderness Fetal  monitoring: baseline 130, mod var, + accel, occasional late decel  Uterine activity: Irregular, infrequent  Dilation: Fingertip Effacement (%): Thick Exam by:: Dr. Annia Friendly  Prenatal labs: ABO, Rh: O/Positive/-- (06/03 1126) Antibody: Negative (06/03 1126) Rubella: 4.67 (06/03 1126) RPR: Non Reactive (08/28 0829)  HBsAg: Negative (06/03 1126)  HIV: Non Reactive (08/28 0829)  GC/Chlamydia: Negative  GBS:  Unknown, pending   2-hr GTT: Normal  Genetic screening: Low risk  Anatomy US: Normal female   Prenatal Transfer Tool  Maternal Diabetes: No Genetic Screening: Normal Maternal Ultrasounds/Referrals: Normal Fetal Ultrasounds or other Referrals:  None Maternal  Substance Abuse:  No Significant Maternal Medications:  None Significant Maternal Lab Results: None  Results for orders placed or performed during the hospital encounter of 01/17/18 (from the past 24 hour(s))  Urinalysis, Routine w reflex microscopic   Collection Time: 01/17/18 10:20 PM  Result Value Ref Range   Color, Urine STRAW (A) YELLOW   APPearance CLEAR CLEAR   Specific Gravity, Urine 1.004 (L) 1.005 - 1.030   pH 8.0 5.0 - 8.0   Glucose, UA NEGATIVE NEGATIVE mg/dL   Hgb urine dipstick NEGATIVE NEGATIVE   Bilirubin Urine NEGATIVE NEGATIVE   Ketones, ur NEGATIVE NEGATIVE mg/dL   Protein, ur NEGATIVE NEGATIVE mg/dL   Nitrite NEGATIVE NEGATIVE   Leukocytes, UA NEGATIVE NEGATIVE  CBC   Collection Time: 01/18/18 12:44 AM  Result Value Ref Range   WBC 7.0 4.0 - 10.5 K/uL   RBC 3.91 3.87 - 5.11 MIL/uL   Hemoglobin 12.0 12.0 - 15.0 g/dL   HCT 16.1 (L) 09.6 - 04.5 %   MCV 87.0 80.0 - 100.0 fL   MCH 30.7 26.0 - 34.0 pg   MCHC 35.3 30.0 - 36.0 g/dL   RDW 40.9 81.1 - 91.4 %   Platelets 205 150 - 400 K/uL   nRBC 0.0 0.0 - 0.2 %  POCT fern test   Collection Time: 01/18/18  1:25 AM  Result Value Ref Range   POCT Fern Test Positive = ruptured amniotic membanes     Patient Active Problem List   Diagnosis Date Noted  . Labor and delivery, indication for care 01/18/2018  . Supervision of normal first pregnancy, antepartum 09/02/2017  . Language barrier affecting health care 09/02/2017    Assessment: Norissa Bartee is a 22 y.o. G1P0 at [redacted]w[redacted]d here for PPROM. Pregnancy course uncomplicated. GBS status unknown, pending.   #Labor: IOL with pit given occasional late noted. Unable to place FB at this time. Serial cervical exams.  #Pain: Desires natural delivery  #FWB: Cat 1 strip currently #ID: GBS unknown, PCN given pre-term status  #MOF: Bottle  #MOC: None  #Circ: N/A   1. PPROM:   -IOL as above   -BMZ given @ 0013, next dose in 24 hrs if not delivered   -Prophylactic PCN  for GBS unknown, culture pending   2. Elevated BP: One BP of 141/43, otherwise wnl since arrival. No history of hypertensive disorder during pregnancy. Will monitor closely.  Allayne Stack  Family Medicine PGY-1  01/18/2018, 2:48 AM   I have seen this patient and agree with the resident's documentation. I have examined them separately, and we have discussed the plan of care.  Aura Camps, MD OB/GYN Fellow

## 2018-01-18 NOTE — MAU Note (Signed)
Pt speaks [place] language. Was unable to get an interpreter for this in MAU.  Friend helped translate and patient does understand some [place].  Advised birthing suites.

## 2018-01-18 NOTE — Progress Notes (Signed)
Dr Aneta Mins discussed pain management with patient using interpreter via ipad.

## 2018-01-18 NOTE — Progress Notes (Signed)
This nurse discussed current POC, answered questions concerning labor, early gestation, spontaneous rupture of membranes, pain management (along with Sharee Pimple, CRNA), expectation for infant at delivery, NICU admission due to gestation with assistance of language line interpreter on ipad.

## 2018-01-18 NOTE — Anesthesia Preprocedure Evaluation (Signed)
Anesthesia Evaluation  Patient identified by MRN, date of birth, ID band Patient awake    Reviewed: Allergy & Precautions, Patient's Chart, lab work & pertinent test results  Airway Mallampati: II  TM Distance: >3 FB     Dental   Pulmonary neg pulmonary ROS,    Pulmonary exam normal        Cardiovascular negative cardio ROS Normal cardiovascular exam     Neuro/Psych negative neurological ROS     GI/Hepatic negative GI ROS, Neg liver ROS,   Endo/Other  negative endocrine ROS  Renal/GU negative Renal ROS     Musculoskeletal   Abdominal   Peds  Hematology negative hematology ROS (+)   Anesthesia Other Findings   Reproductive/Obstetrics (+) Pregnancy                             Lab Results  Component Value Date   WBC 7.0 01/18/2018   HGB 12.0 01/18/2018   HCT 34.0 (L) 01/18/2018   MCV 87.0 01/18/2018   PLT 205 01/18/2018   . Anesthesia Physical Anesthesia Plan  ASA: II  Anesthesia Plan: Epidural   Post-op Pain Management:    Induction:   PONV Risk Score and Plan: Treatment may vary due to age or medical condition  Airway Management Planned: Natural Airway  Additional Equipment:   Intra-op Plan:   Post-operative Plan:   Informed Consent: I have reviewed the patients History and Physical, chart, labs and discussed the procedure including the risks, benefits and alternatives for the proposed anesthesia with the patient or authorized representative who has indicated his/her understanding and acceptance.     Plan Discussed with:   Anesthesia Plan Comments:         Anesthesia Quick Evaluation

## 2018-01-18 NOTE — Anesthesia Pain Management Evaluation Note (Addendum)
  CRNA Pain Management Visit Note  Patient: Kim Blanchard, 22 y.o., female  "Hello I am a member of the anesthesia team at [facility]. We have an anesthesia team available at all times to provide care throughout the hospital, including epidural management and anesthesia for C-section. I don't know your plan for the delivery whether it a natural birth, water birth, IV sedation, nitrous supplementation, doula or epidural, but we want to meet your pain goals."   1.Was your pain managed to your expectations on prior hospitalizations?   No prior hospitalizations  2.What is your expectation for pain management during this hospitalization?     IV pain medication and possibly epidural..  3.How can we help you reach that goal? Patient will notify her RN is she would like an epidural.  Record the patient's initial score and the patient's pain goal.   Pain:4  Pain Goal: .6 [facility] wants you to be able to say your pain was always managed very well.  Jasneet Schobert 01/18/2018

## 2018-01-18 NOTE — Anesthesia Procedure Notes (Signed)
Epidural Patient location during procedure: OB Start time: 01/18/2018 6:50 PM End time: 01/18/2018 6:58 PM  Staffing Anesthesiologist: Marcene Duos, MD Performed: anesthesiologist   Preanesthetic Checklist Completed: patient identified, site marked, surgical consent, pre-op evaluation, timeout performed, IV checked, risks and benefits discussed and monitors and equipment checked  Epidural Patient position: sitting Prep: site prepped and draped and DuraPrep Patient monitoring: continuous pulse ox and blood pressure Approach: midline Location: L4-L5 Injection technique: LOR air  Needle:  Needle type: Tuohy  Needle gauge: 17 G Needle length: 9 cm and 9 Needle insertion depth: 5 cm cm Catheter type: closed end flexible Catheter size: 19 Gauge Catheter at skin depth: 10 cm Test dose: negative  Assessment Events: blood not aspirated, injection not painful, no injection resistance, negative IV test and no paresthesia

## 2018-01-18 NOTE — Progress Notes (Signed)
Pt would like IV pain medicine at this time.  Discussed epidural option again with patient and she declines at this time.  Pt may consider when her family returns.

## 2018-01-18 NOTE — Progress Notes (Signed)
LABOR PROGRESS NOTE  Kim Blanchard is a 22 y.o. G1P0 at [redacted]w[redacted]d  admitted for PPROM.   Subjective: Feeling contractions.   Objective: BP 116/81   Pulse 72   Temp 98.3 F (36.8 C) (Oral)   Resp 16   Ht 5\' 1"  (1.549 m)   Wt 72.1 kg   LMP 05/19/2017   BMI 30.04 kg/m  or  Vitals:   01/18/18 0532 01/18/18 0602 01/18/18 0632 01/18/18 0715  BP: (!) 95/52 118/70 127/79 116/81  Pulse: 78 (!) 57 66 72  Resp: 18 18 17 16   Temp:   98.3 F (36.8 C)   TempSrc:   Oral   Weight:      Height:        Dilation: 1 Effacement (%): 40 Cervical Position: Posterior Station: -2 Presentation: Vertex Exam by:: Dr Annia Friendly FHT: baseline rate 120, moderate varibility, +acel, occasional variables and lates with appropriate recovery Toco: every 2-5 min   Labs: Lab Results  Component Value Date   WBC 7.0 01/18/2018   HGB 12.0 01/18/2018   HCT 34.0 (L) 01/18/2018   MCV 87.0 01/18/2018   PLT 205 01/18/2018    Patient Active Problem List   Diagnosis Date Noted  . Labor and delivery, indication for care 01/18/2018  . Supervision of normal first pregnancy, antepartum 09/02/2017  . Language barrier affecting health care 09/02/2017    Assessment / Plan: 22 y.o. G1P0 at [redacted]w[redacted]d here for PPROM. GBS unknown, PCN.    Labor: IOL w/ pit. Titrate as tolerated. Attempted foley balloon placement at 0745, unsuccessful Fetal Wellbeing:  Reassuring, will monitor closely  Pain Control:  Desires natural delivery, well controlled currently  Anticipated MOD:  NSVD   Aura Camps, MD OB Gyn Fellow 01/18/2018, 7:46 AM

## 2018-01-18 NOTE — Progress Notes (Signed)
Pt desires IV pain medicine while waiting on availability of interpreter for epidural.  IV pain med given

## 2018-01-18 NOTE — Progress Notes (Signed)
Dr Sampson Goon notified of success with reaching an interpreter for this patient.  Sampson Goon is currently in OR and cannot leave room, but will be here ASAP.  Interpreter notified of delay and is willing to stay on the line until Dr Sampson Goon has arrived.

## 2018-01-18 NOTE — Progress Notes (Signed)
LABOR PROGRESS NOTE  Kim Blanchard is a 22 y.o. G1P0 at [redacted]w[redacted]d  admitted for PPROM.   Subjective: Strip Note   Objective: BP 132/74   Pulse (!) 58   Temp 97.8 F (36.6 C) (Oral)   Resp 18   Ht 5\' 1"  (1.549 m)   Wt 72.1 kg   LMP 05/19/2017   BMI 30.04 kg/m  or  Vitals:   01/18/18 0036 01/18/18 0217 01/18/18 0300 01/18/18 0433  BP: (!) 141/73 123/75 135/74 132/74  Pulse: 69 71 (!) 55 (!) 58  Resp: 18 18 18 18   Temp: 98.2 F (36.8 C) 98.1 F (36.7 C)  97.8 F (36.6 C)  TempSrc: Oral Oral  Oral  Weight:      Height:        Dilation: Fingertip Effacement (%): Thick Cervical Position: Posterior Presentation: Vertex Exam by:: Dr. Annia Friendly FHT: baseline rate 120, moderate varibility, +acel, questionable infrequent variable decel w/ appropriate recovery Toco: every 2-5 min   Labs: Lab Results  Component Value Date   WBC 7.0 01/18/2018   HGB 12.0 01/18/2018   HCT 34.0 (L) 01/18/2018   MCV 87.0 01/18/2018   PLT 205 01/18/2018    Patient Active Problem List   Diagnosis Date Noted  . Labor and delivery, indication for care 01/18/2018  . Supervision of normal first pregnancy, antepartum 09/02/2017  . Language barrier affecting health care 09/02/2017    Assessment / Plan: 22 y.o. G1P0 at [redacted]w[redacted]d here for PPROM. GBS unknown, PCN.    Labor: IOL w/ pit. Titrate as tolerated. Serial cervical exams, recheck in the next 1-2 hours.  Fetal Wellbeing:  Reassuring, will monitor closely  Pain Control:  Desires natural delivery, well controlled currently  Anticipated MOD:  NSVD   Kim Blanchard, D.O. Family Medicine PGY-1   01/18/2018, 5:16 AM

## 2018-01-18 NOTE — Progress Notes (Signed)
LABOR PROGRESS NOTE  Kim Blanchard is a 22 y.o. G1P0 at [redacted]w[redacted]d  admitted for PPROM  Subjective: Not feeling any painful contractions Resting well  Objective: BP 122/68   Pulse 60   Temp 98.2 F (36.8 C) (Oral)   Resp 16   Ht 5\' 1"  (1.549 m)   Wt 72.1 kg   LMP 05/19/2017   BMI 30.04 kg/m  or  Vitals:   01/18/18 0715 01/18/18 0736 01/18/18 0808 01/18/18 0842  BP: 116/81 (!) 105/58 122/68   Pulse: 72 74 60   Resp: 16 18 16    Temp:    98.2 F (36.8 C)  TempSrc:    Oral  Weight:      Height:        Dilation: 1 Effacement (%): 40 Cervical Position: Posterior Station: -2 Presentation: Vertex Exam by:: Dr Annia Friendly  FHT: baseline rate 130s, moderate varibility, + acel, no decel Toco: irregular contractions every 4-8 minutes  Labs: Lab Results  Component Value Date   WBC 7.0 01/18/2018   HGB 12.0 01/18/2018   HCT 34.0 (L) 01/18/2018   MCV 87.0 01/18/2018   PLT 205 01/18/2018    Patient Active Problem List   Diagnosis Date Noted  . Labor and delivery, indication for care 01/18/2018  . Supervision of normal first pregnancy, antepartum 09/02/2017  . Language barrier affecting health care 09/02/2017    Assessment / Plan: 22 y.o. G1P0 at [redacted]w[redacted]d here for IOL for PPROM  Labor: FB placed now; continue pitocin (currently at 10) Fetal Wellbeing:  Cat I strip  Pain Control:  Fentanyl x 1 for application of FB Anticipated MOD:  SVD  Gwenevere Abbot, MD OB Fellow  01/18/2018, 8:55 AM

## 2018-01-18 NOTE — Progress Notes (Signed)
Interpreter call complete at this time. Pt states she has no further questions at this time.

## 2018-01-18 NOTE — Progress Notes (Signed)
Interpreter online via [employer] language interpreter service.

## 2018-01-18 NOTE — Progress Notes (Signed)
Phoned Dr Sampson Goon, anesthesiologist, to notify of difficulty finding an interpreter to translate for epidural procedure.  Will notify him once able to make contact.

## 2018-01-19 LAB — CULTURE, BETA STREP (GROUP B ONLY)

## 2018-01-19 NOTE — Progress Notes (Signed)
Post Partum Day 1 Subjective: Patient is doing well without complaints. She is ambulating, voiding and tolerating a regular diet. She denies CP, SOB, lightheadedness/dizziness  Objective: Blood pressure (!) 122/58, pulse 76, temperature 99.2 F (37.3 C), temperature source Oral, resp. rate 18, height 5\' 1"  (1.549 m), weight 72.1 kg, last menstrual period 05/19/2017, SpO2 99 %, unknown if currently breastfeeding.  Physical Exam:  General: alert, cooperative and no distress Lochia: appropriate Uterine Fundus: firm DVT Evaluation: No evidence of DVT seen on physical exam.  Recent Labs    01/18/18 0044  HGB 12.0  HCT 34.0*    Assessment/Plan: Patient is PPD#1 s/p SVD at 34 weeks due to PPROM Patient is doing well Routine postpartum care Discharge planning tomorrow   LOS: 1 day   Nizar Cutler 01/19/2018, 7:12 AM

## 2018-01-19 NOTE — Plan of Care (Signed)
  Problem: Education: Goal: Knowledge of General Education information will improve Description Including pain rating scale, medication(s)/side effects and non-pharmacologic comfort measures Outcome: Completed/Met   Problem: Education: Goal: Knowledge of Childbirth will improve Outcome: Completed/Met Goal: Ability to make informed decisions regarding treatment and plan of care will improve Outcome: Completed/Met Goal: Ability to state and carry out methods to decrease the pain will improve Outcome: Completed/Met   Problem: Coping: Goal: Ability to verbalize concerns and feelings about labor and delivery will improve Outcome: Completed/Met   Problem: Life Cycle: Goal: Ability to make normal progression through stages of labor will improve Outcome: Completed/Met Goal: Ability to effectively push during vaginal delivery will improve Outcome: Completed/Met   Problem: Role Relationship: Goal: Ability to demonstrate positive interaction with the child will improve Outcome: Completed/Met   Problem: Safety: Goal: Risk of complications during labor and delivery will decrease Outcome: Completed/Met   Problem: Pain Management: Goal: Relief or control of pain from uterine contractions will improve Outcome: Completed/Met

## 2018-01-19 NOTE — Anesthesia Postprocedure Evaluation (Signed)
Anesthesia Post Note  Patient: Kim Blanchard  Procedure(s) Performed: AN AD HOC LABOR EPIDURAL     Patient location during evaluation: Mother Baby Anesthesia Type: Epidural Level of consciousness: awake and alert Pain management: pain level controlled Vital Signs Assessment: post-procedure vital signs reviewed and stable Respiratory status: spontaneous breathing, nonlabored ventilation and respiratory function stable Cardiovascular status: stable Postop Assessment: no headache, no backache and epidural receding Anesthetic complications: no    Last Vitals:  Vitals:   01/19/18 0022 01/19/18 0420  BP: (!) 109/91 (!) 122/58  Pulse: 88 76  Resp: 20 18  Temp: 36.6 C 37.3 C  SpO2: 99% 99%    Last Pain:  Vitals:   01/19/18 0615  TempSrc:   PainSc: Asleep   Pain Goal: Patients Stated Pain Goal: 2 (01/18/18 2320)               Rica Records

## 2018-01-19 NOTE — Lactation Note (Signed)
This note was copied from a baby's chart. Lactation Consultation Note  Patient Name: [name] [id number] Date: 01/19/2018   G1P1 vag delivery of LPTI in NICU.  Infant born at [redacted] weeks gestation and SGA.  Mom has not started pumping.  Mom speaks [place].  Obtained audio interpreter, [place], [place]. Mom reports she recently got approved for Medicaid but did not know about WIC.  Reviewed what WIC was with mom and encouraged her to apply.  Inittiated pumping with mom with DEBP.  Demo massage and hand expression prior to pumping with mom.  Mom able to demo back hand expression.  Mom able to get small drops of colostrum with hand expression. Mom has conversion kit.  Showed her how to use as both manual double and single breastpump.Showed mom how to use the Inititate setting on the pump. Reviewed NICU booklet with mom.  Gave wash basins,palmolive, toothbrush and yellow colostrum stickers.  Reviewed how yo use yelow stickers with mom and how to take her pump parts apart to wash and clean.  Pumped with 24 mm flanges.  During pumping moms nipples enlarged and were touching the sides of the pump.  Mom reports comfort.  Discussed good flange fit and encouraged mom to try the 27 mm flanges at next pumping.  Urged pumping 8-10 times day for 15 minutes.  Will follow up with mom as needed.  Maternal Data    Feeding Feeding Type: Breast Fed  LATCH Score Latch: Repeated attempts needed to sustain latch, nipple held in mouth throughout feeding, stimulation needed to elicit sucking reflex.  Audible Swallowing: A few with stimulation  Type of Nipple: Everted at rest and after stimulation  Comfort (Breast/Nipple): Soft / non-tender  Hold (Positioning): Assistance needed to correctly position infant at breast and maintain latch.  LATCH Score: 7  Interventions Interventions: Assisted with latch;Hand express;Breast massage;Adjust position;Position options  Lactation Tools Discussed/Used      Consult Status      Maycee Blasco Thompson Caul 01/19/2018, 11:26 AM

## 2018-01-20 MED ORDER — IBUPROFEN 600 MG PO TABS: 600.0000 mg | ORAL_TABLET | Freq: Four times a day (QID) | ORAL | 0 refills | Status: DC

## 2018-01-20 NOTE — Discharge Summary (Signed)
OB Discharge Summary     Patient Name: Kim Blanchard DOB: 02/26/1996 MRN: [id number]  Date of admission: 01/17/2018 Delivering MD: Allayne Stack   Date of discharge: 01/20/2018  Admitting diagnosis: PROM Intrauterine pregnancy: [redacted]w[redacted]d     Secondary diagnosis:  Active Problems:   Labor and delivery, indication for care  Additional problems: none     Discharge diagnosis: SVD d/t PROM                                                                                                 Post partum procedures:None  Augmentation: Cytotec and Pitocin  Complications: None  Hospital course: Pt was admitted with PROM. IOL secondary to DX. Received BMZ for FLM. SVD without problems. See delivery note for additional information. Postpartum course was unremarkable. Progressed to ambulating, voiding, tolerating diet, good oral pain control. Breast pumping. Infant stable in NICU Felt amendable for discharge home on PPD # 2.  Discharge instructions, follow up and medications reviewed with pt via phone interrupter services.   Physical exam  Vitals:   01/19/18 1603 01/19/18 1939 01/20/18 0535 01/20/18 0756  BP: 125/85 (!) 152/75 139/86 135/74  Pulse: 73 (!) 56 70 61  Resp: 18 17 16 18   Temp: 98.2 F (36.8 C) 99.1 F (37.3 C) 98 F (36.7 C)   TempSrc: Oral Oral Oral   SpO2: 100% 100% 100% 100%  Weight:      Height:       General: alert Lochia: appropriate Uterine Fundus: firm Incision: Healing well with no significant drainage DVT Evaluation: No evidence of DVT seen on physical exam. Labs: Lab Results  Component Value Date   WBC 7.0 01/18/2018   HGB 12.0 01/18/2018   HCT 34.0 (L) 01/18/2018   MCV 87.0 01/18/2018   PLT 205 01/18/2018   CMP Latest Ref Rng & Units 08/08/2017  Glucose 65 - 99 mg/dL 95  BUN 6 - 20 mg/dL <4(U)  Creatinine 9.81 - 1.00 mg/dL 1.91  Sodium 478 - 295 mmol/L 136  Potassium 3.5 - 5.1 mmol/L 4.1  Chloride 101 - 111 mmol/L 107  CO2 22 - 32 mmol/L 22   Calcium 8.9 - 10.3 mg/dL 9.3  Total Protein 6.5 - 8.1 g/dL 6.6  Total Bilirubin 0.3 - 1.2 mg/dL 0.8  Alkaline Phos 38 - 126 U/L 49  AST 15 - 41 U/L 18  ALT 14 - 54 U/L 20    Discharge instruction: per After Visit Summary and "Baby and Me Booklet".  After visit meds:  Allergies as of 01/20/2018   No Known Allergies     Medication List    TAKE these medications   ibuprofen 600 MG tablet Commonly known as:  ADVIL,MOTRIN Take 1 tablet (600 mg total) by mouth every 6 (six) hours.   Prenatal Vitamins 28-0.8 MG Tabs Take 1 tablet by mouth daily.       Diet: routine diet  Activity: Advance as tolerated. Pelvic rest for 6 weeks.   Outpatient follow up:4 weeks Follow up Appt:No future appointments. Follow up Visit:No follow-ups on file.  Postpartum contraception: Undecided  Newborn Data: Live born female  Birth Weight: 4 lb 13.3 oz (2190 g) APGAR: 7, 9  Newborn Delivery   Birth date/time:  01/18/2018 21:13:00 Delivery type:  Vaginal, Spontaneous     Baby Feeding: Breast Disposition:NICU   01/20/2018 Hermina Staggers, MD

## 2018-01-20 NOTE — Discharge Instructions (Signed)
Vaginal Delivery, Care After °Refer to this sheet in the next few weeks. These instructions provide you with information about caring for yourself after vaginal delivery. Your health care provider may also give you more specific instructions. Your treatment has been planned according to current medical practices, but problems sometimes occur. Call your health care provider if you have any problems or questions. °What can I expect after the procedure? °After vaginal delivery, it is common to have: °· Some bleeding from your vagina. °· Soreness in your abdomen, your vagina, and the area of skin between your vaginal opening and your anus (perineum). °· Pelvic cramps. °· Fatigue. ° °Follow these instructions at home: °Medicines °· Take over-the-counter and prescription medicines only as told by your health care provider. °· If you were prescribed an antibiotic medicine, take it as told by your health care provider. Do not stop taking the antibiotic until it is finished. °Driving ° °· Do not drive or operate heavy machinery while taking prescription pain medicine. °· Do not drive for 24 hours if you received a sedative. °Lifestyle °· Do not drink alcohol. This is especially important if you are breastfeeding or taking medicine to relieve pain. °· Do not use tobacco products, including cigarettes, chewing tobacco, or e-cigarettes. If you need help quitting, ask your health care provider. °Eating and drinking °· Drink at least 8 eight-ounce glasses of water every day unless you are told not to by your health care provider. If you choose to breastfeed your baby, you may need to drink more water than this. °· Eat high-fiber foods every day. These foods may help prevent or relieve constipation. High-fiber foods include: °? Whole grain cereals and breads. °? Brown rice. °? Beans. °? Fresh fruits and vegetables. °Activity °· Return to your normal activities as told by your health care provider. Ask your health care provider  what activities are safe for you. °· Rest as much as possible. Try to rest or take a nap when your baby is sleeping. °· Do not lift anything that is heavier than your baby or 10 lb (4.5 kg) until your health care provider says that it is safe. °· Talk with your health care provider about when you can engage in sexual activity. This may depend on your: °? Risk of infection. °? Rate of healing. °? Comfort and desire to engage in sexual activity. °Vaginal Care °· If you have an episiotomy or a vaginal tear, check the area every day for signs of infection. Check for: °? More redness, swelling, or pain. °? More fluid or blood. °? Warmth. °? Pus or a bad smell. °· Do not use tampons or douches until your health care provider says this is safe. °· Watch for any blood clots that may pass from your vagina. These may look like clumps of dark red, brown, or black discharge. °General instructions °· Keep your perineum clean and dry as told by your health care provider. °· Wear loose, comfortable clothing. °· Wipe from front to back when you use the toilet. °· Ask your health care provider if you can shower or take a bath. If you had an episiotomy or a perineal tear during labor and delivery, your health care provider may tell you not to take baths for a certain length of time. °· Wear a bra that supports your breasts and fits you well. °· If possible, have someone help you with household activities and help care for your baby for at least a few days after   you leave the hospital. °· Keep all follow-up visits for you and your baby as told by your health care provider. This is important. °Contact a health care provider if: °· You have: °? Vaginal discharge that has a bad smell. °? Difficulty urinating. °? Pain when urinating. °? A sudden increase or decrease in the frequency of your bowel movements. °? More redness, swelling, or pain around your episiotomy or vaginal tear. °? More fluid or blood coming from your episiotomy or  vaginal tear. °? Pus or a bad smell coming from your episiotomy or vaginal tear. °? A fever. °? A rash. °? Little or no interest in activities you used to enjoy. °? Questions about caring for yourself or your baby. °· Your episiotomy or vaginal tear feels warm to the touch. °· Your episiotomy or vaginal tear is separating or does not appear to be healing. °· Your breasts are painful, hard, or turn red. °· You feel unusually sad or worried. °· You feel nauseous or you vomit. °· You pass large blood clots from your vagina. If you pass a blood clot from your vagina, save it to show to your health care provider. Do not flush blood clots down the toilet without having your health care provider look at them. °· You urinate more than usual. °· You are dizzy or light-headed. °· You have not breastfed at all and you have not had a menstrual period for 12 weeks after delivery. °· You have stopped breastfeeding and you have not had a menstrual period for 12 weeks after you stopped breastfeeding. °Get help right away if: °· You have: °? Pain that does not go away or does not get better with medicine. °? Chest pain. °? Difficulty breathing. °? Blurred vision or spots in your vision. °? Thoughts about hurting yourself or your baby. °· You develop pain in your abdomen or in one of your legs. °· You develop a severe headache. °· You faint. °· You bleed from your vagina so much that you fill two sanitary pads in one hour. °This information is not intended to replace advice given to you by your health care provider. Make sure you discuss any questions you have with your health care provider. °Document Released: 03/16/2000 Document Revised: 08/31/2015 Document Reviewed: 04/03/2015 °Elsevier Interactive Patient Education © 2018 Elsevier Inc. ° °

## 2018-01-20 NOTE — Lactation Note (Signed)
This note was copied from a baby's chart. Lactation Consultation Note  Patient Name: Kim Blanchard [id number] Date: 01/20/2018   Interpreter used [id number].  Mom needed basics reviewed about storage, pumping, and transporting her breastmilk to NICU after DC.    LC answered mom's questions and reviewed supply and demand and importance of pumping 8 times in 24 hours to ensure breast stimulation for milk production,  Mom states she does not have a pump and has applied for [facility].  She desires to have a DEBP.    LC set up pump for mom to check correct flange size.  Mom using 27 which she states is comfortable.  Mom currently on the phone with birth registrar and unable to communicate more with [facility].  LC asked mom to call back for further questions regarding pumping.  LC will notify [facility] mom desires a pump.        Maternal Data    Feeding Feeding Type: Formula  LATCH Score                   Interventions    Lactation Tools Discussed/Used     Consult Status      Maryruth Hancock [facility] 01/20/2018, 12:59 PM

## 2018-01-20 NOTE — Progress Notes (Signed)
Pt and sig other provided [facility] phone number. Pt provided info regarding pump rental from gift shop and how to take DEBP pieces to the NICU for use.

## 2018-01-22 ENCOUNTER — Ambulatory Visit: Payer: Self-pay

## 2018-01-22 NOTE — Lactation Note (Signed)
This note was copied from a baby's chart. Lactation Consultation Note  Patient Name: Kim Blanchard [id number] Date: 01/22/2018 Pecola Leisure is 35.3 weeks.  Called to NICU to assist with feeding.  Baby is 59 hours old.  Breasts are full but not engorged.  Mom states she is pumping every 3 hours.  Positioned baby skin to skin in cross cradle hold.  Milk easily hand expressed.  Baby almost finished with gavage feeding and not showing feeding cues.  Baby suckled a few times with a shallow latch but mostly sleepy.  Reassured mom and reviewed late preterm behaviors.  Instructed to attempt once or twice per day and continue pumping 8-12 times/24 hours.  Baby may benefit from a nipple shield with next assist.   Maternal Data    Feeding Feeding Type: Breast Milk  LATCH Score Latch: Repeated attempts needed to sustain latch, nipple held in mouth throughout feeding, stimulation needed to elicit sucking reflex.  Audible Swallowing: None  Type of Nipple: Everted at rest and after stimulation  Comfort (Breast/Nipple): Soft / non-tender  Hold (Positioning): Assistance needed to correctly position infant at breast and maintain latch.  LATCH Score: 6  Interventions Interventions: Assisted with latch;Skin to skin;Hand express;Breast compression;Adjust position;Support pillows;Position options  Lactation Tools Discussed/Used     Consult Status      Huston Foley 01/22/2018, 2:59 PM

## 2018-01-29 ENCOUNTER — Encounter: Payer: Medicaid Other | Admitting: Family Medicine

## 2018-03-03 ENCOUNTER — Ambulatory Visit (INDEPENDENT_AMBULATORY_CARE_PROVIDER_SITE_OTHER): Payer: Medicaid Other | Admitting: Advanced Practice Midwife

## 2018-03-03 ENCOUNTER — Encounter: Payer: Self-pay | Admitting: Advanced Practice Midwife

## 2018-03-03 DIAGNOSIS — Z1389 Encounter for screening for other disorder: Secondary | ICD-10-CM | POA: Diagnosis not present

## 2018-03-03 DIAGNOSIS — Z3009 Encounter for other general counseling and advice on contraception: Secondary | ICD-10-CM | POA: Diagnosis not present

## 2018-03-03 DIAGNOSIS — Z30017 Encounter for initial prescription of implantable subdermal contraceptive: Secondary | ICD-10-CM | POA: Diagnosis not present

## 2018-03-03 NOTE — Patient Instructions (Signed)
Nexplanon Instructions After Insertion  Keep bandage clean and dry for 24 hours  May use ice/Tylenol/Ibuprofen for soreness or pain  If you develop fever, drainage or increased warmth from incision site-contact office immediately   

## 2018-03-03 NOTE — Progress Notes (Signed)
Subjective:     Kim Blanchard is a 22 y.o. female who presents for a postpartum visit. She is 6 week postpartum following a spontaneous vaginal delivery. I have fully reviewed the prenatal and intrapartum course. The delivery was at 2532w6d gestational weeks. Outcome: spontaneous vaginal delivery. Anesthesia: none. Postpartum course has been uncomplicated. Baby's course has been uncomplicated. Baby is feeding by breast. Bleeding no bleeding. Bowel function is normal. Bladder function is normal. Patient is not sexually active. Contraception method is none and desires nexplanon . Postpartum depression screening: negative.  The following portions of the patient's history were reviewed and updated as appropriate: allergies, current medications, past family history, past medical history, past social history, past surgical history and problem list.  Patient denies intercourse since delivery.   Review of Systems Pertinent items are noted in HPI.   Objective:    BP 116/90   Pulse 91   Wt 160 lb (72.6 kg)   LMP 05/19/2017   BMI 30.23 kg/m   Physical Exam  Constitutional: She is oriented to person, place, and time and well-developed, well-nourished, and in no distress.  Cardiovascular: Normal rate.  Pulmonary/Chest: Effort normal.  Abdominal: Soft. There is no tenderness.  Neurological: She is alert and oriented to person, place, and time.  Skin: Skin is warm and dry.  Psychiatric: Affect normal.  Nursing note and vitals reviewed.   GYNECOLOGY OFFICE PROCEDURE NOTE  Kim Hauline Peckenpaugh is a 22 y.o. G1P0101 here for Nexplanon insertion.  Last pap smear was on 08/2017 and was normal.  No other gynecologic concerns.  Nexplanon Insertion Procedure Patient identified, informed consent performed, consent signed.   Patient does understand that irregular bleeding is a very common side effect of this medication. She was advised to have backup contraception for one week after placement. Pregnancy test in  clinic today was negative.  Appropriate time out taken.  Patient's left arm was prepped and draped in the usual sterile fashion. The ruler used to measure and mark insertion area.  Patient was prepped with alcohol swab and then injected with 3 ml of 1% lidocaine.  She was prepped with betadine, Nexplanon removed from packaging,  Device confirmed in needle, then inserted full length of needle and withdrawn per handbook instructions. Nexplanon was able to palpated in the patient's arm; patient palpated the insert herself. There was minimal blood loss.  Patient insertion site covered with guaze and a pressure bandage to reduce any bruising.  The patient tolerated the procedure well and was given post procedure instructions.    Thressa ShellerHeather Manie Bealer 10:16 AM 03/03/18   Assessment:     Normal  postpartum exam. Pap smear not done at today's visit.   Plan:    1. Contraception: Nexplanon 2. Routine care 3. Follow up in: 1 year  or as needed.

## 2018-05-12 ENCOUNTER — Ambulatory Visit: Payer: Medicaid Other | Admitting: Advanced Practice Midwife

## 2018-10-13 ENCOUNTER — Ambulatory Visit (HOSPITAL_COMMUNITY)
Admission: EM | Admit: 2018-10-13 | Discharge: 2018-10-13 | Disposition: A | Discharge status: Home / Self Care | Payer: Medicaid Other

## 2018-10-13 ENCOUNTER — Ambulatory Visit: Payer: Medicaid Other | Admitting: Obstetrics & Gynecology

## 2018-10-13 ENCOUNTER — Encounter: Payer: Self-pay | Admitting: Obstetrics & Gynecology

## 2018-10-13 NOTE — ED Notes (Signed)
Pt left because she was in the wrong location, she wanted to get her birth control implant removed.

## 2018-10-27 ENCOUNTER — Ambulatory Visit (INDEPENDENT_AMBULATORY_CARE_PROVIDER_SITE_OTHER): Payer: Medicaid Other | Admitting: Advanced Practice Midwife

## 2018-10-27 ENCOUNTER — Other Ambulatory Visit: Payer: Self-pay

## 2018-10-27 ENCOUNTER — Encounter: Payer: Self-pay | Admitting: Advanced Practice Midwife

## 2018-10-27 VITALS — BP 116/79 | HR 86 | Temp 98.6°F | Ht 62.0 in | Wt 148.1 lb

## 2018-10-27 DIAGNOSIS — Z3046 Encounter for surveillance of implantable subdermal contraceptive: Secondary | ICD-10-CM

## 2018-10-27 NOTE — Progress Notes (Signed)
GYNECOLOGY OFFICE PROCEDURE NOTE  Kim Blanchard is a 23 y.o. G1P0101 here for Nexplanon removal.  Last pap smear was on 08/2017 and was normal.  No other gynecologic concerns. Patient desires another pregnancy and is unhappy with the bleeding she is having with the Nexplanon.    Nexplanon Removal Patient identified, informed consent performed, consent signed.   Appropriate time out taken. Nexplanon site identified.  Area prepped in usual sterile fashon. One ml of 1% lidocaine was used to anesthetize the area at the distal end of the implant. A small stab incision was made right beside the implant on the distal portion.  The Nexplanon rod was grasped using hemostats and removed without difficulty.  There was minimal blood loss. There were no complications.  3 ml of 1% lidocaine was injected around the incision for post-procedure analgesia.  Steri-strips were applied over the small incision.  A pressure bandage was applied to reduce any bruising.  The patient tolerated the procedure well and was given post procedure instructions.  Patient is planning to attempt conception.   Marcille Buffy DNP, CNM  10/27/18  4:15 PM

## 2019-03-02 ENCOUNTER — Other Ambulatory Visit: Payer: Self-pay

## 2019-03-02 ENCOUNTER — Ambulatory Visit (INDEPENDENT_AMBULATORY_CARE_PROVIDER_SITE_OTHER): Payer: Medicaid Other | Admitting: *Deleted

## 2019-03-02 ENCOUNTER — Encounter: Payer: Self-pay | Admitting: Family Medicine

## 2019-03-02 ENCOUNTER — Encounter: Payer: Self-pay | Admitting: *Deleted

## 2019-03-02 VITALS — Wt 147.0 lb

## 2019-03-02 DIAGNOSIS — Z3201 Encounter for pregnancy test, result positive: Secondary | ICD-10-CM | POA: Diagnosis not present

## 2019-03-02 DIAGNOSIS — Z3490 Encounter for supervision of normal pregnancy, unspecified, unspecified trimester: Secondary | ICD-10-CM

## 2019-03-02 LAB — POCT PREGNANCY, URINE: Preg Test, Ur: POSITIVE — AB

## 2019-03-02 NOTE — Progress Notes (Signed)
[employer] interpreter used for visit. Number [id number]. Pt had positive upt at home. LMP 01/23/2019. Denies any vaginal bleeding. Occ abd pain but none now. Questions answered and pt agrees with poc. First baby born at 34 months so important to start care. Encouraged to begin PNV and will make appt to begin [facility] when cks out.

## 2019-03-02 NOTE — Progress Notes (Signed)
I reviewed the above documentation and agree with the plan as above  

## 2019-03-24 ENCOUNTER — Ambulatory Visit (INDEPENDENT_AMBULATORY_CARE_PROVIDER_SITE_OTHER): Payer: Self-pay | Admitting: *Deleted

## 2019-03-24 ENCOUNTER — Other Ambulatory Visit: Payer: Self-pay

## 2019-03-24 DIAGNOSIS — O0990 Supervision of high risk pregnancy, unspecified, unspecified trimester: Secondary | ICD-10-CM

## 2019-03-24 DIAGNOSIS — Z789 Other specified health status: Secondary | ICD-10-CM

## 2019-03-24 DIAGNOSIS — O09899 Supervision of other high risk pregnancies, unspecified trimester: Secondary | ICD-10-CM

## 2019-03-24 NOTE — Progress Notes (Signed)
Patient seen and assessed by nursing staff during this encounter. I have reviewed the chart and agree with the documentation and plan.  Kerry Hough, PA-C 03/24/2019 1:00 PM

## 2019-03-24 NOTE — Patient Instructions (Signed)

## 2019-03-24 NOTE — Progress Notes (Signed)
I connected with  Kim Blanchard on 03/24/19 at  8:30 AM EST by telephone and verified that I am speaking with the correct person using two identifiers.   I discussed the limitations, risks, security and privacy concerns of performing an evaluation and management service by telephone and the availability of in person appointments. I also discussed with the patient that there may be a patient responsible charge related to this service. The patient expressed understanding and agreed to proceed. Explained I am completing her New OB Intake today. We discussed Her EDD and that it is based on  sure LMP . I reviewed her allergies, meds, OB History, Medical /Surgical history, and appropriate screenings. I explained I will send her the Babyscripts app- app sent to her while on phone.  I explained we will send a blood pressure cuff to [facility] that will fill that prescription and they  will call her to verify her information. I asked her to bring the blood pressure cuff with her to her first ob appointment so we can show her how to use it. Explained  then we will have her take her blood pressure weekly and enter into the app. Explained she will have some visits in office and some virtually. She already has Community education officer. Reviewed appointment date/ time with her , our location and to wear mask, no visitors. Explained she will have exam, ob bloodwork, hemoglobin a1C, cbg , genetic testing if desired, pap if needed. I scheduled an [place] at 19 weeks and gave her the appointment. She voices understanding.  Junette Bernat,RN 03/24/2019  8:24 AM

## 2019-03-25 ENCOUNTER — Ambulatory Visit (INDEPENDENT_AMBULATORY_CARE_PROVIDER_SITE_OTHER): Payer: Self-pay | Admitting: Medical

## 2019-03-25 ENCOUNTER — Encounter: Payer: Self-pay | Admitting: Medical

## 2019-03-25 ENCOUNTER — Other Ambulatory Visit (HOSPITAL_COMMUNITY)
Admission: RE | Admit: 2019-03-25 | Discharge: 2019-03-25 | Disposition: A | Discharge status: Home / Self Care | Payer: Medicaid Other | Source: Ambulatory Visit | Attending: Medical | Admitting: Medical

## 2019-03-25 VITALS — BP 117/60 | HR 83 | Wt 152.2 lb

## 2019-03-25 DIAGNOSIS — O09211 Supervision of pregnancy with history of pre-term labor, first trimester: Secondary | ICD-10-CM

## 2019-03-25 DIAGNOSIS — O0991 Supervision of high risk pregnancy, unspecified, first trimester: Secondary | ICD-10-CM

## 2019-03-25 DIAGNOSIS — Z3A08 8 weeks gestation of pregnancy: Secondary | ICD-10-CM

## 2019-03-25 DIAGNOSIS — O09899 Supervision of other high risk pregnancies, unspecified trimester: Secondary | ICD-10-CM

## 2019-03-25 DIAGNOSIS — Z23 Encounter for immunization: Secondary | ICD-10-CM

## 2019-03-25 DIAGNOSIS — O0990 Supervision of high risk pregnancy, unspecified, unspecified trimester: Secondary | ICD-10-CM

## 2019-03-25 DIAGNOSIS — Z789 Other specified health status: Secondary | ICD-10-CM

## 2019-03-25 MED ORDER — PRENATAL 19 29-1 MG PO TABS: 1.0000 | ORAL_TABLET | Freq: Every day | ORAL | 11 refills | Status: DC

## 2019-03-25 NOTE — Progress Notes (Signed)
   PRENATAL VISIT NOTE  Subjective:  Kim Blanchard is a 23 y.o. G2P0101 at [redacted]w[redacted]d being seen today for her first prenatal visit for this pregnancy.  [name] for the following issues for this high-risk pregnancy and has Language barrier affecting health care; Supervision of high risk pregnancy, antepartum; and History of preterm delivery, currently pregnant on their problem list.  Patient reports no complaints.  Contractions: Not present. Vag. Bleeding: None.  Movement: Absent. Denies leaking of fluid.   She is planning to breastfeed. Unsure about contraception.   The following portions of the patient's history were reviewed and updated as appropriate: allergies, current medications, past family history, past medical history, past social history, past surgical history and problem list.   Objective:   Vitals:   03/25/19 0844  BP: 117/60  Pulse: 83  Weight: 152 lb 3.2 oz (69 kg)    Fetal Status: Fetal Heart Rate (bpm): 178   Movement: Absent     General:  Alert, oriented and cooperative. Patient is in no acute distress.  Skin: Skin is warm and dry. No rash noted.   Cardiovascular: Normal heart rate and rhythm noted  Respiratory: Normal respiratory effort, no problems with respiration noted. Clear to auscultation.   Abdomen: Soft, gravid, appropriate for gestational age. Normal bowel sounds. Non-tender. Pain/Pressure: Absent     Pelvic: Cervical exam performed      closed, thick, posterior Normal cervical contour, no lesions, no bleeding following pap, normal discharge Breast: symmetric, no masses, no discharge  Extremities: Normal range of motion.  Edema: None  Mental Status: Normal mood and affect. Normal behavior. Normal judgment and thought content.   Assessment and Plan:  Pregnancy: G2P0101 at [redacted]w[redacted]d 1. Supervision of high risk pregnancy, antepartum - Culture, OB Urine - Flu Vaccine QUAD 36+ mos IM - Obstetric Panel, Including HIV - Hemoglobin A1c -  Cytology - PAP( [place]) - Prenatal Vit-DSS-Fe Fum-FA (PRENATAL 19) 29-1 MG TABS; Take 1 tablet by mouth daily.  Dispense: 30 tablet; Refill: 11  2. History of preterm delivery, currently pregnant - Discussed option to start [name] at 16 weeks, patient agrees  3. Language barrier affecting health care - Interpreter present   Discussed the nature of our practice with multiple providers Normal visit cadence discussed ff  Preterm labor symptoms and general obstetric precautions including but not limited to vaginal bleeding, contractions, leaking of fluid and fetal movement were reviewed in detail with the patient. Please refer to After Visit Summary for other counseling recommendations.   Return in about 4 weeks (around 04/22/2019) for [facility], In-Person.  Future Appointments  Date Time Provider [place]  06/05/2019  2:00 PM WH-MFC [place] 3 WH-MFCUS MFC-US    Kerry Hough, PA-C

## 2019-03-25 NOTE — Patient Instructions (Signed)
Safe Medications in Pregnancy   Acne:  Benzoyl Peroxide  Salicylic Acid   Backache/Headache:  Tylenol: 2 regular strength every 4 hours OR        2 Extra strength every 6 hours   Colds/Coughs/Allergies:  Benadryl (alcohol free) 25 mg every 6 hours as needed  Breath right strips  Claritin  Cepacol throat lozenges  Chloraseptic throat spray  Cold-Eeze- up to three times per day  Cough drops, alcohol free  Flonase (by prescription only)  Guaifenesin  Mucinex  Robitussin DM (plain only, alcohol free)  Saline nasal spray/drops  Sudafed (pseudoephedrine) & Actifed * use only after [redacted] weeks gestation and if you do not have high blood pressure  Tylenol  Vicks Vaporub  Zinc lozenges  Zyrtec   Constipation:  Colace  Ducolax suppositories  Fleet enema  Glycerin suppositories  Metamucil  Milk of magnesia  Miralax  Senokot  Smooth move tea   Diarrhea:  Kaopectate  Imodium A-D   *NO pepto Bismol   Hemorrhoids:  Anusol  Anusol HC  Preparation H  Tucks   Indigestion:  Tums  Maalox  Mylanta  Zantac  Pepcid   Insomnia:  Benadryl (alcohol free) 107m every 6 hours as needed  Tylenol PM  Unisom, no Gelcaps   Leg Cramps:  Tums  MagGel   Nausea/Vomiting:  Bonine  Dramamine  Emetrol  Ginger extract  Sea bands  Meclizine  Nausea medication to take during pregnancy:  Unisom (doxylamine succinate 25 mg tablets) Take one tablet daily at bedtime. If symptoms are not adequately controlled, the dose can be increased to a maximum recommended dose of two tablets daily (1/2 tablet in the morning, 1/2 tablet mid-afternoon and one at bedtime).  Vitamin B6 1075mtablets. Take one tablet twice a day (up to 200 mg per day).   Skin Rashes:  Aveeno products  Benadryl cream or 2540mvery 6 hours as needed  Calamine Lotion  1% cortisone cream   Yeast infection:  Gyne-lotrimin 7  Monistat 7    **If taking multiple medications, please check labels to avoid  duplicating the same active ingredients  **take medication as directed on the label  ** Do not exceed 4000 mg of tylenol in 24 hours  **Do not take medications that contain aspirin or ibuprofen        AREA PEDIATRIC/FAMILY PRACTICE PHYSICIANS  [place]outheast [place] ([phone] [facility] o CDavy PiqueD; EniGwendlyn DeutscherD; HalWalker KehrD; HenAndria FramesD; McDiarmid, MD; McIDutch QuintD; NeaNori RiisD; Wal[place] AmberD StockwellGreValle VistaC [phone]-Fri 8:30-12:30, 1:30-5:00 o Providers come to see babies at [facility] Medicaid . EagHart BraEmpireoviders who accept newborns: KoiDorthy CoolerD; MorOrland MustardD; WolStephanie AcreD o 380HemetreAguas BuenasC [phone]-Fri 8:00-5:30 o Babies seen by providers at [facility] NOT accept Medicaid o Please call early in hospitalization for appointment (limited availability)  . Mustard SeeRichmondD o [address]GreWedderburnC [id number]([phone], Tue, Thur, Fri 8:30-5:00, Wed 10:00-7:00 (closed 1-2pm) o Babies seen by [facility] o Accepting Medicaid . RubToledoD o 112WaynesbororeAltoC [phone]-Fri 8:30-5:00, Sat 8:30-12:00 o Provider comes to see babies at WomVerdeldicaid o Must have been referred from current patients or contacted office prior  to delivery . [place] [facility] ([place] for [place]) Franne Forts, MD; Tamera Punt, MD; Doneen Poisson, MD; Fatima Sanger, MD; Wynetta Emery, MD; Jess Barters, MD; Tami Ribas, MD; Herbert Moors, MD; Derrell Lolling, MD; Dorothyann Peng, MD; Lucious Groves, NP; Baldo Ash, NP o [address], [place], [place] [address] o [phone] o Mon, Tue, Thur, Fri 8:30-5:30, Wed 9:30-5:30, Sat 8:30-12:30 o Babies seen by [facility]  providers o Accepting Medicaid o Only accepting infants of first-time parents or siblings of current patients [facility] discharge coordinator will make follow-up appointment . Baltazar Najjar o [place] [address] [place], [place]  [address] o [phone]   Fax - [phone] . [facility] o [address], [place], [place]  [address] o Phone - [phone]   Fax [phone] . [place], [place], [place], [place]  [address] o [phone]  East/[place]ortheast [place] [phone]) . [facility] Reginal Lutes, MD; Jacklynn Ganong, MD; Torrie Mayers, MD; MD; Rosana Hoes, MD; Servando Salina, MD; Rose Fillers, MD; Rex Kras, MD; Corinna Capra, MD; Volney American, MD; Trilby Drummer, MD; Janann Colonel, MD; Jimmye Norman, [place], [place], [place] [address] o [phone] o Mon-Fri 8:30-5:00 (extended evenings Mon-Thur as needed), Sat-Sun 10:00-1:00 o Providers come to see babies at [place] Medicaid for families of first-time babies and families with all children in the household age 54 and under. Must register with office prior to making appointment (M-F only). . [place], NP; Tomi Bamberger, MD; Redmond School, MD; [place], [address] [place], [place] [address] o [phone] o Mon-Fri 8:00-5:00 o Babies seen by providers at [facility] o Does NOT accept Medicaid/Commercial Insurance Only . [facility] at [place] ([facility])  Marnee Guarneri, MD; Drema Dallas, MD; Montine Circle, MD; Vilma Prader, MD; Vanita Panda, MD; Alfonso Ramus, MD; Ruthann Cancer, MD; Roxanne Mins, MD; Rosalva Ferron, MD; Polly Cobia, MD o [address] [place], [place] [address] o [phone] o Mon-Fri 8:30-5:30, Sat (Oct.-Mar.) 9:00-1:00 o Babies seen by providers at [place] [phone]) . [facility] Peers, MD; Suzan Slick, MD o [address], [place], [place] [address] o [phone] o Mon-Fri 8:30-5:00, Sat  8:30-12:00 o Providers come to see babies at [facility] o Does NOT accept Medicaid . [place] at [place], [place]; [place], MD; [place], [place]; Nancy Fetter, MD; Moreen Fowler, [place], [place], [place] [address] o [phone] o Mon-Fri 8:00-5:00 o Babies seen by providers at [facility] o Does NOT accept Medicaid o Only accepting babies of parents who are patients o Please call early in hospitalization for appointment (limited availability) . [facility] Blanca Friend, MD; Sharlene Motts, MD; Rod Can, MD; Warner Mccreedy, NP; Sabra Heck, MD; Ermalinda Memos, MD; Sharlett Iles, NP; Aurther Loft, MD; Jerrye Beavers, MD; Marcello Moores, MD; Berline Lopes, MD; Charolette Forward, MD o [place]. [place], [place], [place] [address] o [phone] o Mon-Fri 8:00-5:00, Sat 9:00-12:00 o Providers come to see babies at [facility] o Does NOT accept [facility] [phone]) . [place] at [place] providers accepting new patients: Dayna Ramus, NP; [place], [place], [place], [place] [address] o [phone] o Mon-Fri 8:00-5:00 o Babies seen by providers at [facility] o Does NOT accept Medicaid o Only accepting babies of parents who are patients o Please call early in hospitalization for appointment (limited availability) . [facility] Oswaldo Conroy, MD; Sheran Lawless, MD o [address] [place], [place] [address] o (  [phone] (press 1 to schedule appointment) o Mon-Fri 8:00-5:00 o Providers come to see babies at [facility] o Does NOT accept Medicaid . [facility] Jodi Mourning, MD o [address], [place], [place] [address] o [phone] o Mon-Fri 8:30-5:00 (lunch 12:30-1:00), extended hours by appointment only Wed 5:00-6:30 o Babies seen by [facility] providers o Accepting Medicaid . [place] at Evalyn Casco, MD; [place], MD; Ethlyn Gallery, MD o [place], [place], [place] [address] o [phone] o Mon-Fri  8:00-5:00 o Babies seen by [facility] providers o Does NOT accept Medicaid . Therapist, music at [place] Grant, MD; Yong Channel, MD; [place], [address] [place], [place] [address] o [phone] o Mon-Fri 8:00-5:00 o Babies seen by [facility] providers o Does NOT accept Medicaid . [place], [place]; [place], [place]; [place], NP; Albertina Parr, MD; Frederic Jericho, MD; Ronney Lion, MD; Carlos Levering, NP; Jerelene Redden, NP; Tomasita Crumble, NP; Ronelle Nigh, NP; Corinna Lines, MD; [place], MD o [address] [place], [place] [address] o [phone] o Mon-Fri 8:30-5:00, Sat 10:00-1:00 o Providers come to see babies at [facility] o Does NOT accept Medicaid o Free prenatal information session Tuesdays at 4:45pm . [facility] Porfirio Oar, MD; [place], [place]; [place], [place]; [place], [place]., [place] [address] o [phone] o Mon-Fri 7:30-5:30 o Babies seen by [facility] providers . [facility] o [address] [place], [place], [place]  [address] o [phone]   Fax - [phone]  [place] [phone] & [phone]) . [place], [place] o [address] [place], [place] [address] o [phone] o Mon-Thur 8:00-6:00 o Providers come to see babies at [place] Medicaid . [place], NP; Melford Aase, MD; [place], [place]; [place], [place]., [place], [place] [address] o [phone] o Mon-Thur 7:30-7:30, Fri 7:30-4:30 o Babies seen by [facility] providers o Accepting Medicaid . [facility] Nyra Jabs, MD; Cristino Martes, NP; Gertie Baron, MD o [place] [address], [place], [place] [address] o [phone] o Mon-Fri 8:30-5:00, Sat 8:30-12:00 o Providers come to see babies at [place] Medicaid o Must have "Meet & Greet" appointment at office prior to delivery . [place] ([place]) Jodene Nam, MD; Juleen China, MD; Clydene Laming, [place], [place], [place] [address] o [phone] o Mon-Wed 8:00-6:00, Thur-Fri 8:00-5:00, Sat 9:00-12:00 o Providers come to see babies at [facility] o Does NOT accept Medicaid o Only accepting siblings of current patients . [facility] [place], [place], [place], [place]  [address] o [phone]   Fax [phone] . [place] at [place] [address] [place], [place]  [address] o [phone]   Fax - Nelson [place] [phone] & [phone]) . Therapist, music at [place], DO; [place], [address]., [place], [place] [address] o [phone] o Mon-Fri 7:00-5:00 o Babies seen by [facility] providers o Does NOT accept Medicaid . [place], MD; [place], [place]; [place], [place], [place], [place] [address] o [phone] o Mon-Fri 8:00-5:00 o Babies seen by [facility] providers o Accepting Medicaid . [place], MD; [place], [place]; [place]ough[place], NP; [place], [place], [place],  [address] o [phone] o Mon-Fri 8:00-5:00 o Babies seen by providers at [place] [phone]) . [facility] [place], [place] o [address] [place]., [place], [place] [address] o [phone] o Mon-Fri 8:00-5:00 o Babies seen by [facility] providers o Does NOT accept Medicaid o Limited availability, please call early in hospitalization to schedule follow-up . [facility] Leilani Merl, PA; Maisie Fus, MD; [place], MD; [place], [place]; Jeannine Kitten, MD; [place], [place] [facility] [address], [place], [place] [address] o [phone] o Mon-Fri 8:30-5:00, Sat 9:00-12:00 o Babies seen by providers at  [facility] o Accepting Medicaid o Please register online then schedule online or call office o [email or web address] . [place] ([place] [facility] [place]) Kristian Covey, NP; Dwyane Dee, MD; Leonidas Romberg, PA o [address] [place], [place], [place] [address] o [phone] o Mon-Fri 8:00-5:00 o Babies seen by providers at [facility] o Accepting Medicaid . [place] ([facility] [employer]) Dairl Ponder, MD; Rayvon Char, NP; Melina Modena, MD o [address] [place], [place], [place] [address] o [phone] o Mon-Fri 8:00-5:30, Sat&Sun by appointment (phones open at 8:30) o Babies seen by [facility] providers o Accepting Medicaid o Must be a first-time baby or sibling of current patient . [place], [address], [place], [place]  [address] o [phone]   Fax - [phone]  [place] [phone] & [phone]) . [place], [place]; [place], [place]; Benjamine Mola, MD; [place], [place]; Harrell Lark, MD o [address], [place], [place] [address] o [phone] o Mon-Thur 8:00-7:00, Fri 8:00-5:00, Sat 8:00-12:00, Sun 9:00-12:00 o Babies seen by [facility] providers o Accepting Medicaid . [facility] at [facility] MD; Ruthann Cancer, MD; [facility], MD o [address], [place], [place] [address] o [phone] o Mon-Thur 8:00-5:00 o Babies seen by providers at [facility] o Accepting Medicaid . [facility] at [place], MD; Coe-Go[place], MD; Amedeo Plenty, MD; Bobby Rumpf, MD; List, MD; Lavonia Drafts, MD; Ruthann Cancer, MD; Selinda Eon, MD; Audie Box, MD; Jim Like, MD; Christie Nottingham, MD; Hubbard Hartshorn, MD; Modena Nunnery, MD o [address] [place], [place] [address] o [phone] o Mon-Fri 8:00-5:30, Sat (Oct.-Mar.) 9:00-1:00 o Babies seen by providers at [facility] o Accepting Medicaid o Must fill out  new patient packet, available online at [email or web address] . [place] ([facility]) Barnabas Lister, NP; Kenton Kingfisher, NP; Claiborne Billings, NP; Rolla Plate, MD; [place], [place]; Carola Rhine, MD; Tyron Russell, MD; Delia Chimes, NP o [address], [place], [place] [address] o [phone] o Mon-Thur 8:00-5:30, Fri 8:00-5:00 o Babies seen by providers at [place] [phone]) . [place], [place]; Oliver, MD; Dennard Schaumann, MD; [place], [place] o [address] [place], [place] [address] o [phone] o Mon-Fri 8:00-5:00 o Babies seen by providers at [place] [phone]) . [place] at [place], [place]; Olen Pel, MD; [place], [place], [place], [place] [address] o [phone] o Mon-Fri 8:00-5:00 o Babies seen by providers at [facility] o Does NOT accept Medicaid o Limited appointment availability, please call early in hospitalization  . Therapist, music at [place], DO; [place],  MD o [address] [place], [place] [address] o [phone] o Mon-Fri 8:00-5:00 o Babies seen by [facility] providers o Does NOT accept Medicaid . [facility] Su Grand, MD; Guy Sandifer, MD; [place], [place]; [place], [place] [address], [place], [place] [address] o [phone] o Mon-Fri 8:00-5:00 o After hours clinic [facility] [place], [place] [address]) [phone] Mon-Fri 5:00-8:00, Sat 12:00-6:00, Sun 10:00-4:00 o Babies seen by [facility] providers o Accepting Medicaid . [place] at [facility] o [address] [place], [place]  [address] o [phone]   Fax - [phone]  [place] [phone]) . Therapist, music at [facility], MD o [address] [place], [place], [place] [address] o [phone] o Mon-Fri 8:00-5:00 o Babies seen by [facility] providers o Does NOT accept Medicaid . [place] ([place] [facility] [place]) Bing Neighbors, MD o [address] [place], [place], [place] [address] o [phone] o Mon-Thur 8:00-7:00, Fri 8:00-5:00, Sat 8:00-12:00 o Babies seen by providers at [facility] o Accepting Medicaid - but does not have vaccinations in office (must be received elsewhere) o Limited availability, please call early in hospitalization  [place] ([address]) . [place], [place], [place] [address] o [phone]  Fax [phone]   First Trimester of Pregnancy  The first trimester of pregnancy is from week 1 until the end of week 13 (months 1 through 3). During this time, your baby will begin to develop inside you. At 6-8 weeks, the eyes and face are formed, and the heartbeat can be seen on ultrasound. At the end of 12 weeks, all the baby's organs are formed. Prenatal care is all the medical care you receive before the birth of your baby. Make sure you get good prenatal care and follow all of your doctor's instructions. Follow these instructions at home: Medicines  Take over-the-counter and prescription medicines only as told by your doctor. Some medicines are safe and some medicines are not safe during pregnancy.  Take a prenatal vitamin that contains at least 600 micrograms (mcg) of folic acid.  If you have trouble pooping (constipation), take medicine that will make your stool soft (stool softener) if your doctor approves. Eating and drinking   Eat regular, healthy meals.  Your doctor will tell you the amount of weight gain that is right for you.  Avoid raw meat and uncooked cheese.  If you feel sick to your stomach (nauseous) or throw up (vomit): ? Eat 4 or 5 small meals a day instead of 3 large meals. ? Try eating a few soda crackers. ? Drink liquids between meals instead of during meals.  To prevent  constipation: ? Eat foods that are high in fiber, like fresh fruits and vegetables, whole grains, and beans. ? Drink enough fluids to keep your pee (urine) clear or pale yellow. Activity  Exercise only as told by your doctor. Stop exercising if you have cramps or pain in your lower belly (abdomen) or low back.  Do not exercise if it is too hot, too humid, or if you are in a place of great height (high altitude).  Try to avoid standing for long periods of time. Move your legs often if you must stand in one place for a long time.  Avoid heavy lifting.  Wear low-heeled shoes. Sit and stand up straight.  You can have sex unless  your doctor tells you not to. Relieving pain and discomfort  Wear a good support bra if your breasts are sore.  Take warm water baths (sitz baths) to soothe pain or discomfort caused by hemorrhoids. Use hemorrhoid cream if your doctor says it is okay.  Rest with your legs raised if you have leg cramps or low back pain.  If you have puffy, bulging veins (varicose veins) in your legs: ? Wear support hose or compression stockings as told by your doctor. ? Raise (elevate) your feet for 15 minutes, 3-4 times a day. ? Limit salt in your food. Prenatal care  Schedule your prenatal visits by the twelfth week of pregnancy.  Write down your questions. Take them to your prenatal visits.  Keep all your prenatal visits as told by your doctor. This is important. Safety  Wear your seat belt at all times when driving.  Make a list of emergency phone numbers. The list should include numbers for family, friends, the hospital, and police and fire departments. General instructions  Ask your doctor for a referral to a local prenatal class. Begin classes no later than at the start of month 6 of your pregnancy.  Ask for help if you need counseling or if you need help with nutrition. Your doctor can give you advice or tell you where to go for help.  Do not use hot tubs,  steam rooms, or saunas.  Do not douche or use tampons or scented sanitary pads.  Do not cross your legs for long periods of time.  Avoid all herbs and alcohol. Avoid drugs that are not approved by your doctor.  Do not use any tobacco products, including cigarettes, chewing tobacco, and electronic cigarettes. If you need help quitting, ask your doctor. You may get counseling or other support to help you quit.  Avoid cat litter boxes and soil used by cats. These carry germs that can cause birth defects in the baby and can cause a loss of your baby (miscarriage) or stillbirth.  Visit your dentist. At home, brush your teeth with a soft toothbrush. Be gentle when you floss. Contact a doctor if:  You are dizzy.  You have mild cramps or pressure in your lower belly.  You have a nagging pain in your belly area.  You continue to feel sick to your stomach, you throw up, or you have watery poop (diarrhea).  You have a bad smelling fluid coming from your vagina.  You have pain when you pee (urinate).  You have increased puffiness (swelling) in your face, hands, legs, or ankles. Get help right away if:  You have a fever.  You are leaking fluid from your vagina.  You have spotting or bleeding from your vagina.  You have very bad belly cramping or pain.  You gain or lose weight rapidly.  You throw up blood. It may look like coffee grounds.  You are around people who have MicronesiaGerman measles, fifth disease, or chickenpox.  You have a very bad headache.  You have shortness of breath.  You have any kind of trauma, such as from a fall or a car accident. Summary  The first trimester of pregnancy is from week 1 until the end of week 13 (months 1 through 3).  To take care of yourself and your unborn baby, you will need to eat healthy meals, take medicines only if your doctor tells you to do so, and do activities that are safe for you and your baby.  Keep  all follow-up visits as told by  your doctor. This is important as your doctor will have to ensure that your baby is healthy and growing well. This information is not intended to replace advice given to you by your health care provider. Make sure you discuss any questions you have with your health care provider. Document Released: 09/05/2007 Document Revised: 07/10/2018 Document Reviewed: 03/27/2016 Elsevier Patient Education  2020 [employer].

## 2019-03-26 LAB — OBSTETRIC PANEL, INCLUDING HIV
Antibody Screen: NEGATIVE
Basophils Absolute: 0 10*3/uL (ref 0.0–0.2)
Basos: 0 %
EOS (ABSOLUTE): 0.1 10*3/uL (ref 0.0–0.4)
Eos: 1 %
HIV Screen 4th Generation wRfx: NONREACTIVE
Hematocrit: 39.5 % (ref 34.0–46.6)
Hemoglobin: 13.4 g/dL (ref 11.1–15.9)
Hepatitis B Surface Ag: NEGATIVE
Immature Grans (Abs): 0 10*3/uL (ref 0.0–0.1)
Immature Granulocytes: 0 %
Lymphocytes Absolute: 1.5 10*3/uL (ref 0.7–3.1)
Lymphs: 24 %
MCH: 30.1 pg (ref 26.6–33.0)
MCHC: 33.9 g/dL (ref 31.5–35.7)
MCV: 89 fL (ref 79–97)
Monocytes Absolute: 0.5 10*3/uL (ref 0.1–0.9)
Monocytes: 9 %
Neutrophils Absolute: 4.2 10*3/uL (ref 1.4–7.0)
Neutrophils: 66 %
Platelets: 287 10*3/uL (ref 150–450)
RBC: 4.45 x10E6/uL (ref 3.77–5.28)
RDW: 12.1 % (ref 11.7–15.4)
RPR Ser Ql: NONREACTIVE
Rh Factor: POSITIVE
Rubella Antibodies, IGG: 4.47 index (ref >0.99)
WBC: 6.4 10*3/uL (ref 3.4–10.8)

## 2019-03-26 LAB — HEMOGLOBIN A1C
Est. average glucose Bld gHb Est-mCnc: 91 mg/dL
Hgb A1c MFr Bld: 4.8 % (ref 4.8–5.6)

## 2019-03-29 LAB — URINE CULTURE, OB REFLEX

## 2019-03-29 LAB — CULTURE, OB URINE

## 2019-03-30 ENCOUNTER — Telehealth: Payer: Self-pay | Admitting: *Deleted

## 2019-03-30 NOTE — Telephone Encounter (Signed)
Corrected prescription ( changed to auto injectors/ sq)and refaxed it . I also called and left a message for [place] at [facility] care connection that we had corrected and refaxed the prescription. Stellah Donovan,RN

## 2019-03-30 NOTE — Telephone Encounter (Signed)
Received a voicemail from [place] at [facility] stating the Prescription they received has the DOB cut off and the RX was for ?  And  They cannot process it. States they need a new rx .  Natsuko Kelsay,RN

## 2019-03-31 LAB — CYTOLOGY - PAP
Chlamydia: NEGATIVE
Comment: NEGATIVE
Comment: NORMAL
Diagnosis: NEGATIVE
Neisseria Gonorrhea: NEGATIVE

## 2019-04-03 NOTE — L&D Delivery Note (Signed)
OB/GYN Faculty Practice Delivery Note  Lissie Hinesley is a 24 y.o. G2P0101 s/p NSVD at [redacted]w[redacted]d. She was admitted for management of spontaneous labor.  ROM: 4h 56m with light mec fluid GBS Status: positive COVID Status: positive Maximum Maternal Temperature: 97.20F  Labor Progress: . Pt presented to L&D for management of spontaneous labor. At first cervical check she was noted to be 5.5/50/ballotable. At 0900 she was started on pitocin and AROM for light meconium. She continued to progress well with good fetal tolerance on pitocin. Pt was complete at 1315 and then delivered at 1341 as noted below.  Delivery Date/Time: 11/01/19 at 1341. Delivery: Called to room and patient was complete and pushing. Head delivered vertex. No nuchal cord present. Shoulder and body delivered in usual fashion. Infant with spontaneous cry, placed on mother's abdomen, dried and stimulated. Cord clamped x 2 after 1-minute delay, and cut by baby's father under my direct supervision. Cord blood drawn. Placenta delivered spontaneously with gentle cord traction. Fundus firm with massage and Pitocin. Labia, perineum, vagina, and cervix were inspected and notable for bilateral superficial, hemostatic periurethral lacerations that were not repaired.  Placenta: intact Complications: none Lacerations: bilateral superficial, hemostatic periurethral lacerations w/o repair EBL: Analgesia: epidural  Infant: female  APGARs 7 & 9 at 1 and 5 minutes respectively  wt pending  Lynnda Shields, MD OB/GYN Fellow, Faculty Practice

## 2019-04-27 ENCOUNTER — Encounter: Payer: Medicaid Other | Admitting: Family Medicine

## 2019-05-05 ENCOUNTER — Ambulatory Visit (INDEPENDENT_AMBULATORY_CARE_PROVIDER_SITE_OTHER): Payer: Self-pay | Admitting: Obstetrics and Gynecology

## 2019-05-05 ENCOUNTER — Other Ambulatory Visit: Payer: Self-pay

## 2019-05-05 ENCOUNTER — Other Ambulatory Visit: Payer: Self-pay | Admitting: Obstetrics and Gynecology

## 2019-05-05 ENCOUNTER — Encounter: Payer: Self-pay | Admitting: Obstetrics and Gynecology

## 2019-05-05 VITALS — BP 110/68 | HR 83 | Wt 149.0 lb

## 2019-05-05 DIAGNOSIS — O0990 Supervision of high risk pregnancy, unspecified, unspecified trimester: Secondary | ICD-10-CM

## 2019-05-05 DIAGNOSIS — O09899 Supervision of other high risk pregnancies, unspecified trimester: Secondary | ICD-10-CM

## 2019-05-05 DIAGNOSIS — Z3A14 14 weeks gestation of pregnancy: Secondary | ICD-10-CM

## 2019-05-05 DIAGNOSIS — Z789 Other specified health status: Secondary | ICD-10-CM

## 2019-05-05 DIAGNOSIS — O2342 Unspecified infection of urinary tract in pregnancy, second trimester: Secondary | ICD-10-CM

## 2019-05-05 DIAGNOSIS — O0992 Supervision of high risk pregnancy, unspecified, second trimester: Secondary | ICD-10-CM

## 2019-05-05 DIAGNOSIS — O2341 Unspecified infection of urinary tract in pregnancy, first trimester: Secondary | ICD-10-CM

## 2019-05-05 DIAGNOSIS — O09892 Supervision of other high risk pregnancies, second trimester: Secondary | ICD-10-CM

## 2019-05-05 DIAGNOSIS — O2340 Unspecified infection of urinary tract in pregnancy, unspecified trimester: Secondary | ICD-10-CM | POA: Insufficient documentation

## 2019-05-05 MED ORDER — NITROFURANTOIN MONOHYD MACRO 100 MG PO CAPS: ORAL_CAPSULE | ORAL | 0 refills | Status: DC

## 2019-05-05 NOTE — Progress Notes (Signed)
Prenatal Visit Note Date: 05/05/2019 Clinic: [facility]  Subjective:  Kim Blanchard is a 24 y.o. G2P0101 at [redacted]w[redacted]d being seen today for ongoing prenatal care.  [name] for the following issues for this high-risk pregnancy and has Language barrier affecting health care; Supervision of high risk pregnancy, antepartum; History of preterm delivery, currently pregnant; and UTI in pregnancy on their problem list.  Patient reports no complaints.   Contractions: Not present. Vag. Bleeding: None.  Movement: Absent. Denies leaking of fluid.   The following portions of the patient's history were reviewed and updated as appropriate: allergies, current medications, past family history, past medical history, past social history, past surgical history and problem list. Problem list updated.  Objective:   Vitals:   05/05/19 0828  BP: 110/68  Pulse: 83  Weight: 149 lb (67.6 kg)    Fetal Status: Fetal Heart Rate (bpm): 154   Movement: Absent     General:  Alert, oriented and cooperative. Patient is in no acute distress.  Skin: Skin is warm and dry. No rash noted.   Cardiovascular: Normal heart rate noted  Respiratory: Normal respiratory effort, no problems with respiration noted  Abdomen: Soft, gravid, appropriate for gestational age. Pain/Pressure: Absent     Pelvic:  Cervical exam deferred        Extremities: Normal range of motion.  Edema: None  Mental Status: Normal mood and affect. Normal behavior. Normal judgment and thought content.   Urinalysis:      Assessment and Plan:  Pregnancy: G2P0101 at [redacted]w[redacted]d  1. Supervision of high risk pregnancy, antepartum Anatomy u/s already ordered - Genetic Screening  2. History of preterm delivery, currently pregnant Has 17p injections. Told to start in 2wks at home  3. Urinary tract infection in mother during first trimester of pregnancy macrobid sent in toc 6wks  4. Language barrier affecting health  care Interpreter used  Preterm labor symptoms and general obstetric precautions including but not limited to vaginal bleeding, contractions, leaking of fluid and fetal movement were reviewed in detail with the patient. Please refer to After Visit Summary for other counseling recommendations.  Return in about 1 month (around 06/02/2019) for high risk, in person, same day as u/s.   Azusa Bing, MD

## 2019-05-11 ENCOUNTER — Telehealth (INDEPENDENT_AMBULATORY_CARE_PROVIDER_SITE_OTHER): Payer: Self-pay | Admitting: Lactation Services

## 2019-05-11 DIAGNOSIS — O0990 Supervision of high risk pregnancy, unspecified, unspecified trimester: Secondary | ICD-10-CM

## 2019-05-11 NOTE — Telephone Encounter (Signed)
Received call from Tobi Bastos at [facility] that pt has [facility] and Cruzita Lederer is not covered by that plan. Tobi Bastos is requesting a call back at [phone] Ext [phone].   Called pt to advise her that she needs to call her Medicaid Caseworker to see about getting her Medicaid switched to Pregnancy Medicaid otherwise pt may need to be considered for [facility]. Called pt at 4:30 with assistance of [employer] # [phone]. LM for patient to call the office at her earliest convenience. Will see if pt calls back and then will call [name].

## 2019-05-12 ENCOUNTER — Encounter: Payer: Self-pay | Admitting: General Practice

## 2019-05-12 NOTE — Telephone Encounter (Addendum)
Called pt with assistance of [facility] interpreter, Dan # [phone]. Pt did not answer. LM for pt to call the office at her earliest convenience. Letter drafted and translated to [facility] to send to patient.   Called [name] and spoke with rep who reports Tobi Bastos is not available, LM to let her know that I am reaching out to patient to have her update her Medicaid and what the possibility of pt being considered for [facility] care for [facility] if she is not able to update to pregnancy Medicaid.

## 2019-05-25 ENCOUNTER — Encounter: Payer: Self-pay | Admitting: *Deleted

## 2019-06-05 ENCOUNTER — Encounter: Payer: Self-pay | Admitting: Obstetrics & Gynecology

## 2019-06-05 ENCOUNTER — Other Ambulatory Visit: Payer: Self-pay

## 2019-06-05 ENCOUNTER — Encounter (HOSPITAL_COMMUNITY): Payer: Self-pay

## 2019-06-05 ENCOUNTER — Ambulatory Visit (HOSPITAL_COMMUNITY): Payer: Medicaid Other | Admitting: *Deleted

## 2019-06-05 ENCOUNTER — Ambulatory Visit (HOSPITAL_COMMUNITY)
Admission: RE | Admit: 2019-06-05 | Discharge: 2019-06-05 | Disposition: A | Discharge status: Home / Self Care | Payer: Self-pay | Source: Ambulatory Visit | Attending: Medical | Admitting: Medical

## 2019-06-05 ENCOUNTER — Ambulatory Visit (INDEPENDENT_AMBULATORY_CARE_PROVIDER_SITE_OTHER): Payer: Self-pay | Admitting: Obstetrics & Gynecology

## 2019-06-05 VITALS — BP 120/68 | HR 85 | Wt 154.3 lb

## 2019-06-05 DIAGNOSIS — O09212 Supervision of pregnancy with history of pre-term labor, second trimester: Secondary | ICD-10-CM

## 2019-06-05 DIAGNOSIS — O0990 Supervision of high risk pregnancy, unspecified, unspecified trimester: Secondary | ICD-10-CM | POA: Insufficient documentation

## 2019-06-05 DIAGNOSIS — O09892 Supervision of other high risk pregnancies, second trimester: Secondary | ICD-10-CM

## 2019-06-05 DIAGNOSIS — O09899 Supervision of other high risk pregnancies, unspecified trimester: Secondary | ICD-10-CM

## 2019-06-05 DIAGNOSIS — Z789 Other specified health status: Secondary | ICD-10-CM

## 2019-06-05 DIAGNOSIS — Z3A19 19 weeks gestation of pregnancy: Secondary | ICD-10-CM

## 2019-06-05 DIAGNOSIS — Z8751 Personal history of pre-term labor: Secondary | ICD-10-CM

## 2019-06-05 MED ORDER — HYDROXYPROGESTERONE CAPROATE 275 MG/1.1ML ~~LOC~~ SOAJ
275.0000 mg | Freq: Once | SUBCUTANEOUS | Status: AC
  Administered 2019-07-10: 275 mg via SUBCUTANEOUS

## 2019-06-05 NOTE — Patient Instructions (Signed)
Hydroxyprogesterone caproate injection for pregnancy What is this medicine? HYDROXYPROGESTERONE (hye drox ee proe JES ter one) is a female hormone. This medicine is used in women who are pregnant and who have delivered a baby too early (preterm) in the past. It helps lower the risk of having a preterm baby again. This medicine may be used for other purposes; ask your health care provider or pharmacist if you have questions. COMMON BRAND NAME(S): [name] What should I tell my health care provider before I take this medicine? They need to know if you have any of these conditions:  breast, cervical, uterine, or vaginal cancer  depression  diabetes or prediabetes  heart disease  high blood pressure  history of blood clots  kidney disease  liver disease  lung or breathing disease, like asthma  migraine headaches  seizures  vaginal bleeding  an unusual or allergic reaction to hydroxyprogesterone, other hormones, castor oil, benzyl alcohol, other medicines, foods, dyes, or preservatives  breast-feeding How should I use this medicine? This medicine is for injection into a muscle or under the skin. You will receive an injection once every week (every 7 days) as directed during your pregnancy. It is given by a health care professional in a hospital or clinic setting. Talk to your pediatrician regarding the use of this medicine in children. While this drug may be prescribed for pregnant women as young as 16 years, precautions do apply. Overdosage: If you think you have taken too much of this medicine contact a poison control center or emergency room at once. NOTE: This medicine is only for you. Do not share this medicine with others. What if I miss a dose? It is important not to miss your dose. Call your doctor or health care professional if you are unable to keep an appointment. What may interact with this medicine? Significant interactions are not expected. This list may not  describe all possible interactions. Give your health care provider a list of all the medicines, herbs, non-prescription drugs, or dietary supplements you use. Also tell them if you smoke, drink alcohol, or use illegal drugs. Some items may interact with your medicine. What should I watch for while using this medicine? Your pregnancy will be monitored carefully while you are receiving this medicine. What side effects may I notice from receiving this medicine? Side effects that you should report to your doctor or health care professional as soon as possible:  allergic reactions like skin rash, itching or hives, swelling of the face, lips, or tongue  breathing problems  depressed mood  increase in blood pressure  increased hunger or thirst  increased urination  signs and symptoms of a blood clot such as breathing problems; changes in vision; chest pain; severe, sudden headache; pain, swelling, warmth in the leg; trouble speaking; sudden numbness or weakness of the face, arm or leg  unusually weak or tired  unusual vaginal bleeding  yellowing of the eyes or skin Side effects that usually do not require medical attention (report to your doctor or health care professional if they continue or are bothersome):  diarrhea  fluid retention and swelling  nausea  pain, redness, or irritation at site where injected This list may not describe all possible side effects. Call your doctor for medical advice about side effects. You may report side effects to FDA at 1-800-FDA-1088. Where should I keep my medicine? This drug is given in a hospital or clinic and will not be stored at home. NOTE: This sheet is a   summary. It may not cover all possible information. If you have questions about this medicine, talk to your doctor, pharmacist, or health care provider.  2020 Elsevier/Gold Standard (2016-12-02 11:14:47) Second Trimester of Pregnancy  The second trimester is from week 14 through week 27  (month 4 through 6). This is often the time in pregnancy that you feel your best. Often times, morning sickness has lessened or quit. You may have more energy, and you may get hungry more often. Your unborn baby is growing rapidly. At the end of the sixth month, he or she is about 9 inches long and weighs about 1 pounds. You will likely feel the baby move between 18 and 20 weeks of pregnancy. Follow these instructions at home: Medicines  Take over-the-counter and prescription medicines only as told by your doctor. Some medicines are safe and some medicines are not safe during pregnancy.  Take a prenatal vitamin that contains at least 600 micrograms (mcg) of folic acid.  If you have trouble pooping (constipation), take medicine that will make your stool soft (stool softener) if your doctor approves. Eating and drinking   Eat regular, healthy meals.  Avoid raw meat and uncooked cheese.  If you get low calcium from the food you eat, talk to your doctor about taking a daily calcium supplement.  Avoid foods that are high in fat and sugars, such as fried and sweet foods.  If you feel sick to your stomach (nauseous) or throw up (vomit): ? Eat 4 or 5 small meals a day instead of 3 large meals. ? Try eating a few soda crackers. ? Drink liquids between meals instead of during meals.  To prevent constipation: ? Eat foods that are high in fiber, like fresh fruits and vegetables, whole grains, and beans. ? Drink enough fluids to keep your pee (urine) clear or pale yellow. Activity  Exercise only as told by your doctor. Stop exercising if you start to have cramps.  Do not exercise if it is too hot, too humid, or if you are in a place of great height (high altitude).  Avoid heavy lifting.  Wear low-heeled shoes. Sit and stand up straight.  You can continue to have sex unless your doctor tells you not to. Relieving pain and discomfort  Wear a good support bra if your breasts are tender.   Take warm water baths (sitz baths) to soothe pain or discomfort caused by hemorrhoids. Use hemorrhoid cream if your doctor approves.  Rest with your legs raised if you have leg cramps or low back pain.  If you develop puffy, bulging veins (varicose veins) in your legs: ? Wear support hose or compression stockings as told by your doctor. ? Raise (elevate) your feet for 15 minutes, 3-4 times a day. ? Limit salt in your food. Prenatal care  Write down your questions. Take them to your prenatal visits.  Keep all your prenatal visits as told by your doctor. This is important. Safety  Wear your seat belt when driving.  Make a list of emergency phone numbers, including numbers for family, friends, the hospital, and police and fire departments. General instructions  Ask your doctor about the right foods to eat or for help finding a counselor, if you need these services.  Ask your doctor about local prenatal classes. Begin classes before month 6 of your pregnancy.  Do not use hot tubs, steam rooms, or saunas.  Do not douche or use tampons or scented sanitary pads.  Do not cross your  legs for long periods of time.  Visit your dentist if you have not done so. Use a soft toothbrush to brush your teeth. Floss gently.  Avoid all smoking, herbs, and alcohol. Avoid drugs that are not approved by your doctor.  Do not use any products that contain nicotine or tobacco, such as cigarettes and e-cigarettes. If you need help quitting, ask your doctor.  Avoid cat litter boxes and soil used by cats. These carry germs that can cause birth defects in the baby and can cause a loss of your baby (miscarriage) or stillbirth. Contact a doctor if:  You have mild cramps or pressure in your lower belly.  You have pain when you pee (urinate).  You have bad smelling fluid coming from your vagina.  You continue to feel sick to your stomach (nauseous), throw up (vomit), or have watery poop (diarrhea).  You  have a nagging pain in your belly area.  You feel dizzy. Get help right away if:  You have a fever.  You are leaking fluid from your vagina.  You have spotting or bleeding from your vagina.  You have severe belly cramping or pain.  You lose or gain weight rapidly.  You have trouble catching your breath and have chest pain.  You notice sudden or extreme puffiness (swelling) of your face, hands, ankles, feet, or legs.  You have not felt the baby move in over an hour.  You have severe headaches that do not go away when you take medicine.  You have trouble seeing. Summary  The second trimester is from week 14 through week 27 (months 4 through 6). This is often the time in pregnancy that you feel your best.  To take care of yourself and your unborn baby, you will need to eat healthy meals, take medicines only if your doctor tells you to do so, and do activities that are safe for you and your baby.  Call your doctor if you get sick or if you notice anything unusual about your pregnancy. Also, call your doctor if you need help with the right food to eat, or if you want to know what activities are safe for you. This information is not intended to replace advice given to you by your health care provider. Make sure you discuss any questions you have with your health care provider. Document Revised: 07/11/2018 Document Reviewed: 04/24/2016 Elsevier Patient Education  2020 [employer].

## 2019-06-05 NOTE — Progress Notes (Signed)
   PRENATAL VISIT NOTE  Subjective:  Kim Blanchard is a 24 y.o. G2P0101 at [redacted]w[redacted]d being seen today for ongoing prenatal care.  [name] for the following issues for this high-risk pregnancy and has Language barrier affecting health care; Supervision of high risk pregnancy, antepartum; History of preterm delivery, currently pregnant; and UTI in pregnancy on their problem list.  Patient reports no complaints.  Contractions: Not present. Vag. Bleeding: None.  Movement: Present. Denies leaking of fluid.   The following portions of the patient's history were reviewed and updated as appropriate: allergies, current medications, past family history, past medical history, past social history, past surgical history and problem list.   Objective:   Vitals:   06/05/19 0940  BP: 120/68  Pulse: 85  Weight: 154 lb 4.8 oz (70 kg)    Fetal Status: Fetal Heart Rate (bpm): 156   Movement: Present     General:  Alert, oriented and cooperative. Patient is in no acute distress.  Skin: Skin is warm and dry. No rash noted.   Cardiovascular: Normal heart rate noted  Respiratory: Normal respiratory effort, no problems with respiration noted  Abdomen: Soft, gravid, appropriate for gestational age.  Pain/Pressure: Absent     Pelvic: Cervical exam deferred        Extremities: Normal range of motion.  Edema: None  Mental Status: Normal mood and affect. Normal behavior. Normal judgment and thought content.   Assessment and Plan:  Pregnancy: G2P0101 at [redacted]w[redacted]d 1. Supervision of high risk pregnancy, antepartum Has [place] appt that may need to be rescheduled  2. History of preterm delivery, currently pregnant Will order 17 P  3. Language barrier affecting health care [place]; [place] interpreter present  Preterm labor symptoms and general obstetric precautions including but not limited to vaginal bleeding, contractions, leaking of fluid and fetal movement were reviewed in detail with the  patient. Please refer to After Visit Summary for other counseling recommendations.   Return in about 4 weeks (around 07/03/2019) for [facility] every week.  Future Appointments  Date Time Provider Department Center  06/05/2019  2:15 PM WH-MFC NURSE WH-MFC MFC-US  06/05/2019  2:15 PM WH-MFC [place] 4 WH-MFCUS MFC-US    Scheryl Darter, MD

## 2019-06-05 NOTE — Progress Notes (Signed)
Interpreter Anusia U.

## 2019-06-10 ENCOUNTER — Encounter: Payer: Self-pay | Admitting: General Practice

## 2019-06-10 ENCOUNTER — Telehealth (HOSPITAL_COMMUNITY): Payer: Self-pay | Admitting: Lactation Services

## 2019-06-10 ENCOUNTER — Telehealth: Payer: Self-pay | Admitting: Lactation Services

## 2019-06-10 NOTE — Telephone Encounter (Addendum)
Called patient with assistance of [employer], # [phone] in regards to her [facility] prescription and follow up to see if she has applied for Pregnancy Medicaid. Left message for patient to call the office as soon as possible to discuss.   Patient has been attending appointments and up coming [name] injections are scheduled.

## 2019-06-10 NOTE — Telephone Encounter (Signed)
Tobi Bastos from [place] called to see if patient has gotten her Medicaid switches. [name] has not been able to reach the patient to offer [facility] for patient. They reports patient is not returning calls and answering the phone. Tobi Bastos plans to follow up next week.

## 2019-06-12 ENCOUNTER — Ambulatory Visit (INDEPENDENT_AMBULATORY_CARE_PROVIDER_SITE_OTHER): Payer: Self-pay

## 2019-06-12 ENCOUNTER — Other Ambulatory Visit: Payer: Self-pay

## 2019-06-12 DIAGNOSIS — Z3400 Encounter for supervision of normal first pregnancy, unspecified trimester: Secondary | ICD-10-CM

## 2019-06-12 NOTE — Progress Notes (Addendum)
  Henrietta Dine, CMA 06/12/2019  10:36 AM  Pt never received [name], Patient had to apply for pregnancy medicaid, which she states she did receive but info was sent directly to Hospital. Gave patient # to [facility] to provide # too so that we can get the [facility] sent to our office. Patient had Interpreter & she explained everything.Patient verbalized understanding.

## 2019-06-13 NOTE — Progress Notes (Signed)
Patient ID: Kim Blanchard, female   DOB: February 15, 1996, 24 y.o.   MRN: [id number] Patient seen and assessed by nursing staff during this encounter. I have reviewed the chart and agree with the documentation and plan.  Scheryl Darter, MD 06/13/2019 10:09 AM

## 2019-06-19 ENCOUNTER — Telehealth: Payer: Self-pay | Admitting: *Deleted

## 2019-06-19 ENCOUNTER — Ambulatory Visit: Payer: Medicaid Other

## 2019-06-19 NOTE — Telephone Encounter (Signed)
VM message left by [facility]. She stated that pt still only has [facility] which does not cover [name]. They have tried to reach out to pt multiple times and do not get any response. Please attempt to reach pt and call [facility] Connection to discuss further.

## 2019-06-23 ENCOUNTER — Telehealth (INDEPENDENT_AMBULATORY_CARE_PROVIDER_SITE_OTHER): Payer: Self-pay

## 2019-06-23 ENCOUNTER — Telehealth: Payer: Self-pay

## 2019-06-23 DIAGNOSIS — O09299 Supervision of pregnancy with other poor reproductive or obstetric history, unspecified trimester: Secondary | ICD-10-CM

## 2019-06-23 DIAGNOSIS — O09899 Supervision of other high risk pregnancies, unspecified trimester: Secondary | ICD-10-CM

## 2019-06-23 NOTE — Telephone Encounter (Signed)
Called patient using [facility] Kim Blanchard id# [id number], to do two way with [facility] Case Mngr Rogelio Seen. No answer, left message with Anna's contact information. Patient has Nurse visit on 03/26 @ 10:20 will connect with Tobi Bastos if pt shows.

## 2019-06-23 NOTE — Telephone Encounter (Signed)
Pharmacy called to set up shipment of [name] injections to our office. Pharmacy staff requested a call back to verify date/time of delivery. Staff member stated the plan is for monthly deliveries of 4 auto-injectors.

## 2019-06-24 NOTE — Telephone Encounter (Signed)
Chart reviewed for nurse encounter. Agree with plan of care.   Emilee Market L, DO 06/24/2019 7:44 AM

## 2019-06-24 NOTE — Telephone Encounter (Signed)
Called [name] to confirm delivery of [name] to office. Patient has been authorized for complete coverage for [facility]. [name] plans to have medication delivered 3/25 to the office. They will automatically ship every 4 weeks until 36 weeks.

## 2019-06-26 ENCOUNTER — Ambulatory Visit: Payer: Medicaid Other

## 2019-06-26 ENCOUNTER — Encounter: Payer: Self-pay | Admitting: *Deleted

## 2019-06-26 NOTE — Progress Notes (Signed)
Patient [name] not arrived before appointment time today . Patient DNKA appoinment.  [name] did arrive later . Will ask registrar to call patient to reschedule her appointment for makena injection because next appointment not until 4/7. Aydan Levitz,RN

## 2019-06-30 ENCOUNTER — Other Ambulatory Visit: Payer: Self-pay

## 2019-06-30 ENCOUNTER — Ambulatory Visit (INDEPENDENT_AMBULATORY_CARE_PROVIDER_SITE_OTHER): Payer: Self-pay

## 2019-06-30 VITALS — BP 107/55 | HR 68 | Wt 155.9 lb

## 2019-06-30 DIAGNOSIS — O09899 Supervision of other high risk pregnancies, unspecified trimester: Secondary | ICD-10-CM

## 2019-06-30 DIAGNOSIS — O09219 Supervision of pregnancy with history of pre-term labor, unspecified trimester: Secondary | ICD-10-CM

## 2019-06-30 MED ORDER — HYDROXYPROGESTERONE CAPROATE 275 MG/1.1ML ~~LOC~~ SOAJ
275.0000 mg | Freq: Once | SUBCUTANEOUS | Status: AC
  Administered 2019-06-30: 275 mg via SUBCUTANEOUS

## 2019-06-30 NOTE — Progress Notes (Signed)
[employer] Interpreter # [phone]  Erline Hau here for 17-P  Injection.  Injection administered without complication. Patient will return in one week for next injection.  Ralene Bathe, RN 06/30/2019  10:50 AM

## 2019-06-30 NOTE — Progress Notes (Signed)
Patient seen and assessed by nursing staff during this encounter. I have reviewed the chart and agree with the documentation and plan. I have also made any necessary editorial changes.  Vonzella Nipple, PA-C 06/30/2019 4:37 PM

## 2019-07-08 ENCOUNTER — Encounter: Payer: Medicaid Other | Admitting: Family Medicine

## 2019-07-10 ENCOUNTER — Encounter: Payer: Self-pay | Admitting: Obstetrics and Gynecology

## 2019-07-10 ENCOUNTER — Other Ambulatory Visit: Payer: Self-pay

## 2019-07-10 ENCOUNTER — Ambulatory Visit (INDEPENDENT_AMBULATORY_CARE_PROVIDER_SITE_OTHER): Payer: Self-pay | Admitting: Obstetrics and Gynecology

## 2019-07-10 VITALS — BP 106/57 | HR 80 | Wt 154.3 lb

## 2019-07-10 DIAGNOSIS — O09212 Supervision of pregnancy with history of pre-term labor, second trimester: Secondary | ICD-10-CM

## 2019-07-10 DIAGNOSIS — O09899 Supervision of other high risk pregnancies, unspecified trimester: Secondary | ICD-10-CM

## 2019-07-10 DIAGNOSIS — O0990 Supervision of high risk pregnancy, unspecified, unspecified trimester: Secondary | ICD-10-CM

## 2019-07-10 DIAGNOSIS — Z789 Other specified health status: Secondary | ICD-10-CM

## 2019-07-10 DIAGNOSIS — Z3A24 24 weeks gestation of pregnancy: Secondary | ICD-10-CM

## 2019-07-10 NOTE — Progress Notes (Signed)
   PRENATAL VISIT NOTE  Subjective:  Kim Blanchard is a 24 y.o. G2P0101 at [redacted]w[redacted]d being seen today for ongoing prenatal care.  [name] for the following issues for this high-risk pregnancy and has Language barrier affecting health care; Supervision of high risk pregnancy, antepartum; History of preterm delivery, currently pregnant; and UTI in pregnancy on their problem list.  Patient reports no complaints.  Contractions: Not present. Vag. Bleeding: None.  Movement: Present. Denies leaking of fluid.   The following portions of the patient's history were reviewed and updated as appropriate: allergies, current medications, past family history, past medical history, past social history, past surgical history and problem list.   Objective:   Vitals:   07/10/19 1114  BP: (!) 106/57  Pulse: 80  Weight: 154 lb 4.8 oz (70 kg)    Fetal Status: Fetal Heart Rate (bpm): 149 Fundal Height: 24 cm Movement: Present     General:  Alert, oriented and cooperative. Patient is in no acute distress.  Skin: Skin is warm and dry. No rash noted.   Cardiovascular: Normal heart rate noted  Respiratory: Normal respiratory effort, no problems with respiration noted  Abdomen: Soft, gravid, appropriate for gestational age.  Pain/Pressure: Absent     Pelvic: Cervical exam deferred        Extremities: Normal range of motion.  Edema: None  Mental Status: Normal mood and affect. Normal behavior. Normal judgment and thought content.   Assessment and Plan:  Pregnancy: G2P0101 at [redacted]w[redacted]d 1. Supervision of high risk pregnancy, antepartum Patient is doing well without complaints Third trimester labs with glucola next visit  2. History of preterm delivery, currently pregnant Continue weekly 17-P  3. Language barrier affecting health care Interpreter present  Preterm labor symptoms and general obstetric precautions including but not limited to vaginal bleeding, contractions, leaking of fluid and  fetal movement were reviewed in detail with the patient. Please refer to After Visit Summary for other counseling recommendations.   Return in about 4 weeks (around 08/07/2019) for in person, ROB, High risk, 2 hr glucola next visit, weekly for 17-P.  Future Appointments  Date Time Provider Department Center  07/17/2019  9:20 AM WOC-WOCA NURSE WOC-WOCA WOC  07/24/2019  9:00 AM WOC-WOCA NURSE WOC-WOCA WOC  08/03/2019  2:30 PM WOC-WOCA NURSE WOC-WOCA WOC    Catalina Antigua, MD

## 2019-07-16 ENCOUNTER — Encounter: Payer: Self-pay | Admitting: *Deleted

## 2019-07-17 ENCOUNTER — Ambulatory Visit (INDEPENDENT_AMBULATORY_CARE_PROVIDER_SITE_OTHER): Payer: Self-pay

## 2019-07-17 ENCOUNTER — Other Ambulatory Visit: Payer: Self-pay

## 2019-07-17 VITALS — BP 100/64 | HR 89 | Wt 155.5 lb

## 2019-07-17 DIAGNOSIS — Z3A00 Weeks of gestation of pregnancy not specified: Secondary | ICD-10-CM

## 2019-07-17 DIAGNOSIS — O09899 Supervision of other high risk pregnancies, unspecified trimester: Secondary | ICD-10-CM

## 2019-07-17 MED ORDER — HYDROXYPROGESTERONE CAPROATE 275 MG/1.1ML ~~LOC~~ SOAJ
275.0000 mg | Freq: Once | SUBCUTANEOUS | Status: AC
  Administered 2019-07-17: 275 mg via SUBCUTANEOUS

## 2019-07-17 NOTE — Progress Notes (Signed)
Kim Blanchard here for 17-P  Injection.  Injection administered without complication. Patient will return in one week for next injection.  Ralene Bathe, RN 07/17/2019  10:25 AM

## 2019-07-17 NOTE — Progress Notes (Signed)
Chart reviewed for nurse visit. Agree with plan of care.   Marny Lowenstein, PA-C 07/17/2019 11:40 AM

## 2019-07-24 ENCOUNTER — Ambulatory Visit: Payer: Medicaid Other

## 2019-07-28 ENCOUNTER — Other Ambulatory Visit: Payer: Self-pay

## 2019-07-28 ENCOUNTER — Ambulatory Visit (INDEPENDENT_AMBULATORY_CARE_PROVIDER_SITE_OTHER): Payer: Self-pay

## 2019-07-28 VITALS — BP 112/77 | HR 83 | Wt 153.6 lb

## 2019-07-28 DIAGNOSIS — Z3A00 Weeks of gestation of pregnancy not specified: Secondary | ICD-10-CM

## 2019-07-28 DIAGNOSIS — O09899 Supervision of other high risk pregnancies, unspecified trimester: Secondary | ICD-10-CM

## 2019-07-28 MED ORDER — HYDROXYPROGESTERONE CAPROATE 275 MG/1.1ML ~~LOC~~ SOAJ
275.0000 mg | Freq: Once | SUBCUTANEOUS | Status: AC
  Administered 2019-07-28: 275 mg via SUBCUTANEOUS

## 2019-07-28 NOTE — Progress Notes (Signed)
Chart reviewed for nurse visit. Agree with plan of care.   Leary Mcnulty L, DO 07/28/2019 1:07 PM

## 2019-07-28 NOTE — Progress Notes (Signed)
Kim Blanchard here for 17-P  Injection.  Injection administered without complication. Patient will return in one week for next injection.  Ralene Bathe, RN 07/28/2019  11:45 AM

## 2019-08-03 ENCOUNTER — Ambulatory Visit: Payer: Medicaid Other

## 2019-08-11 ENCOUNTER — Other Ambulatory Visit: Payer: Self-pay | Admitting: *Deleted

## 2019-08-11 DIAGNOSIS — O09899 Supervision of other high risk pregnancies, unspecified trimester: Secondary | ICD-10-CM

## 2019-08-11 DIAGNOSIS — O0990 Supervision of high risk pregnancy, unspecified, unspecified trimester: Secondary | ICD-10-CM

## 2019-08-12 ENCOUNTER — Encounter: Payer: Medicaid Other | Admitting: Obstetrics and Gynecology

## 2019-08-12 ENCOUNTER — Other Ambulatory Visit: Payer: Medicaid Other

## 2019-08-19 ENCOUNTER — Ambulatory Visit (INDEPENDENT_AMBULATORY_CARE_PROVIDER_SITE_OTHER): Payer: Self-pay | Admitting: *Deleted

## 2019-08-19 ENCOUNTER — Ambulatory Visit (INDEPENDENT_AMBULATORY_CARE_PROVIDER_SITE_OTHER): Payer: Self-pay | Admitting: Obstetrics & Gynecology

## 2019-08-19 ENCOUNTER — Other Ambulatory Visit: Payer: Self-pay

## 2019-08-19 ENCOUNTER — Other Ambulatory Visit: Payer: Medicaid Other

## 2019-08-19 ENCOUNTER — Encounter: Payer: Self-pay | Admitting: *Deleted

## 2019-08-19 VITALS — BP 101/60 | HR 88 | Temp 98.4°F | Wt 159.0 lb

## 2019-08-19 DIAGNOSIS — O0990 Supervision of high risk pregnancy, unspecified, unspecified trimester: Secondary | ICD-10-CM

## 2019-08-19 DIAGNOSIS — O09899 Supervision of other high risk pregnancies, unspecified trimester: Secondary | ICD-10-CM

## 2019-08-19 MED ORDER — HYDROXYPROGESTERONE CAPROATE 275 MG/1.1ML ~~LOC~~ SOAJ
275.0000 mg | Freq: Once | SUBCUTANEOUS | Status: AC
  Administered 2019-08-19: 275 mg via SUBCUTANEOUS

## 2019-08-19 NOTE — Progress Notes (Signed)
Audio interpreter [phone] used for encounter.  [name] 275 mg administered as scheduled. Pt tolerated well.

## 2019-08-19 NOTE — Patient Instructions (Signed)

## 2019-08-19 NOTE — Progress Notes (Signed)
Patient ID: Kim Blanchard, female   DOB: 05/12/1995, 24 y.o.   MRN: [id number] Patient was assessed and managed by nursing staff during this encounter. I have reviewed the chart and agree with the documentation and plan. I have also made any necessary editorial changes.  Scheryl Darter, MD 08/19/2019 3:13 PM

## 2019-08-19 NOTE — Progress Notes (Signed)
Audio interpreter [phone] used for encounter. Pt states she has dizziness and her arm becomes very sore after each [name] injection. Two weeks ago, she needed to leave work due to the dizziness and has not worked since. Pt needed a note for her employer in order to return to work and thought she needed to have an appt in order to receive it.    Patient left without seeing provider

## 2019-08-20 ENCOUNTER — Encounter: Payer: Medicaid Other | Admitting: Obstetrics and Gynecology

## 2019-08-20 LAB — CBC
Hematocrit: 36.8 % (ref 34.0–46.6)
Hemoglobin: 12.2 g/dL (ref 11.1–15.9)
MCH: 30.7 pg (ref 26.6–33.0)
MCHC: 33.2 g/dL (ref 31.5–35.7)
MCV: 93 fL (ref 79–97)
Platelets: 229 10*3/uL (ref 150–450)
RBC: 3.97 x10E6/uL (ref 3.77–5.28)
RDW: 12.2 % (ref 11.7–15.4)
WBC: 8.7 10*3/uL (ref 3.4–10.8)

## 2019-08-20 LAB — RPR: RPR Ser Ql: NONREACTIVE

## 2019-08-20 LAB — GLUCOSE TOLERANCE, 2 HOURS W/ 1HR
Glucose, 1 hour: 164 mg/dL (ref 65–179)
Glucose, 2 hour: 132 mg/dL (ref 65–152)
Glucose, Fasting: 85 mg/dL (ref 65–91)

## 2019-08-20 LAB — HIV ANTIBODY (ROUTINE TESTING W REFLEX): HIV Screen 4th Generation wRfx: NONREACTIVE

## 2019-08-26 ENCOUNTER — Ambulatory Visit: Payer: Medicaid Other

## 2019-08-28 ENCOUNTER — Ambulatory Visit: Payer: Medicaid Other

## 2019-09-02 ENCOUNTER — Ambulatory Visit: Payer: Medicaid Other

## 2019-09-04 ENCOUNTER — Ambulatory Visit (INDEPENDENT_AMBULATORY_CARE_PROVIDER_SITE_OTHER): Payer: Medicaid Other | Admitting: *Deleted

## 2019-09-04 ENCOUNTER — Other Ambulatory Visit: Payer: Self-pay

## 2019-09-04 VITALS — BP 112/63 | HR 79

## 2019-09-04 DIAGNOSIS — O0990 Supervision of high risk pregnancy, unspecified, unspecified trimester: Secondary | ICD-10-CM | POA: Diagnosis not present

## 2019-09-04 DIAGNOSIS — O09899 Supervision of other high risk pregnancies, unspecified trimester: Secondary | ICD-10-CM

## 2019-09-04 MED ORDER — HYDROXYPROGESTERONE CAPROATE 275 MG/1.1ML ~~LOC~~ SOAJ
275.0000 mg | Freq: Once | SUBCUTANEOUS | Status: AC
  Administered 2019-09-04: 275 mg via SUBCUTANEOUS

## 2019-09-04 NOTE — Progress Notes (Signed)
Here for [name] injection. Tolerated without complaint. Reviewed next ob fu 09/09/19 as scheduled. She voices understanding. Julliette Frentz,RN

## 2019-09-09 ENCOUNTER — Encounter: Payer: Medicaid Other | Admitting: Obstetrics and Gynecology

## 2019-09-09 NOTE — Progress Notes (Signed)
Patient was assessed and managed by nursing staff during this encounter. I have reviewed the chart and agree with the documentation and plan. I have also made any necessary editorial changes.  Wellington Bing, MD 09/09/2019 8:27 AM

## 2019-09-11 ENCOUNTER — Ambulatory Visit: Payer: Medicaid Other

## 2019-09-18 ENCOUNTER — Ambulatory Visit (INDEPENDENT_AMBULATORY_CARE_PROVIDER_SITE_OTHER): Payer: Medicaid Other | Admitting: *Deleted

## 2019-09-18 ENCOUNTER — Other Ambulatory Visit: Payer: Self-pay

## 2019-09-18 DIAGNOSIS — O0990 Supervision of high risk pregnancy, unspecified, unspecified trimester: Secondary | ICD-10-CM

## 2019-09-18 DIAGNOSIS — O09893 Supervision of other high risk pregnancies, third trimester: Secondary | ICD-10-CM

## 2019-09-18 DIAGNOSIS — Z3A35 35 weeks gestation of pregnancy: Secondary | ICD-10-CM

## 2019-09-18 DIAGNOSIS — O09899 Supervision of other high risk pregnancies, unspecified trimester: Secondary | ICD-10-CM

## 2019-09-18 DIAGNOSIS — O0993 Supervision of high risk pregnancy, unspecified, third trimester: Secondary | ICD-10-CM | POA: Diagnosis not present

## 2019-09-18 MED ORDER — HYDROXYPROGESTERONE CAPROATE 275 MG/1.1ML ~~LOC~~ SOAJ
275.0000 mg | Freq: Once | SUBCUTANEOUS | Status: AC
  Administered 2019-09-18: 275 mg via SUBCUTANEOUS

## 2019-09-18 NOTE — Progress Notes (Signed)
Patient was assessed and managed by nursing staff during this encounter. I have reviewed the chart and agree with the documentation and plan. I have also made any necessary editorial changes.  Shakthi Scipio, MD 09/18/2019 11:23 AM 

## 2019-09-18 NOTE — Progress Notes (Signed)
Here for 17p.  Tolerated without complaint. instucted to check out and reminded of next ob appointment. Dania Marsan,RN

## 2019-09-25 ENCOUNTER — Ambulatory Visit (INDEPENDENT_AMBULATORY_CARE_PROVIDER_SITE_OTHER): Payer: Medicaid Other | Admitting: Obstetrics & Gynecology

## 2019-09-25 ENCOUNTER — Other Ambulatory Visit: Payer: Self-pay

## 2019-09-25 VITALS — BP 110/73 | HR 86 | Wt 159.6 lb

## 2019-09-25 DIAGNOSIS — O09893 Supervision of other high risk pregnancies, third trimester: Secondary | ICD-10-CM

## 2019-09-25 DIAGNOSIS — O0990 Supervision of high risk pregnancy, unspecified, unspecified trimester: Secondary | ICD-10-CM

## 2019-09-25 DIAGNOSIS — Z3A35 35 weeks gestation of pregnancy: Secondary | ICD-10-CM

## 2019-09-25 DIAGNOSIS — Z8751 Personal history of pre-term labor: Secondary | ICD-10-CM

## 2019-09-25 DIAGNOSIS — Z23 Encounter for immunization: Secondary | ICD-10-CM

## 2019-09-25 DIAGNOSIS — O09899 Supervision of other high risk pregnancies, unspecified trimester: Secondary | ICD-10-CM

## 2019-09-25 DIAGNOSIS — Z603 Acculturation difficulty: Secondary | ICD-10-CM

## 2019-09-25 DIAGNOSIS — O0993 Supervision of high risk pregnancy, unspecified, third trimester: Secondary | ICD-10-CM | POA: Diagnosis not present

## 2019-09-25 MED ORDER — HYDROXYPROGESTERONE CAPROATE 275 MG/1.1ML ~~LOC~~ SOAJ
275.0000 mg | Freq: Once | SUBCUTANEOUS | Status: AC
  Administered 2019-09-25: 275 mg via SUBCUTANEOUS

## 2019-09-25 MED ORDER — BLOOD PRESSURE KIT DEVI: 1.0000 | Freq: Once | 0 refills | Status: AC

## 2019-09-25 MED ORDER — HYDROXYPROGESTERONE CAPROATE 250 MG/ML IM OIL: 250.0000 mg | TOPICAL_OIL | Freq: Once | INTRAMUSCULAR | Status: DC

## 2019-09-25 NOTE — Addendum Note (Signed)
Addended by: Maxwell Marion E on: 09/25/2019 11:49 AM   Modules accepted: Orders

## 2019-09-25 NOTE — Patient Instructions (Signed)
Return to office for any scheduled appointments. Call the office or go to the MAU at [facility] if:  You begin to have strong, frequent contractions  Your water breaks.  Sometimes it is a big gush of fluid, sometimes it is just a trickle that keeps getting your panties wet or running down your legs  You have vaginal bleeding.  It is normal to have a small amount of spotting if your cervix was checked.   You do not feel your baby moving like normal.  If you do not, get something to eat and drink and lay down and focus on feeling your baby move.   If your baby is still not moving like normal, you should call the office or go to MAU.  Any other obstetric concerns.   

## 2019-09-25 NOTE — Progress Notes (Signed)
° °  PRENATAL VISIT NOTE  Subjective:  Kim Blanchard is a 24 y.o. G2P0101 at 60w0dbeing seen today for ongoing prenatal care. KRobbinsinterpreter present for encounter.  [name] for the following issues for this high-risk pregnancy and has Language barrier affecting health care; Supervision of high risk pregnancy, antepartum; History of preterm delivery, currently pregnant; and UTI in pregnancy on their problem list.  Patient reports no complaints.  Contractions: Not present. Vag. Bleeding: None.  Movement: Present. Denies leaking of fluid.   The following portions of the patient's history were reviewed and updated as appropriate: allergies, current medications, past family history, past medical history, past social history, past surgical history and problem list.   Objective:   Vitals:   09/25/19 0927  BP: 110/73  Pulse: 86  Weight: 159 lb 9.6 oz (72.4 kg)    Fetal Status: Fetal Heart Rate (bpm): 135 Fundal Height: 35 cm Movement: Present     General:  Alert, oriented and cooperative. Patient is in no acute distress.  Skin: Skin is warm and dry. No rash noted.   Cardiovascular: Normal heart rate noted  Respiratory: Normal respiratory effort, no problems with respiration noted  Abdomen: Soft, gravid, appropriate for gestational age.  Pain/Pressure: Absent     Pelvic: Cervical exam deferred        Extremities: Normal range of motion.  Edema: None  Mental Status: Normal mood and affect. Normal behavior. Normal judgment and thought content.   Assessment and Plan:  Pregnancy: G2P0101 at 331w0d. History of preterm delivery, currently pregnant [name] given, just one more injection left.   2. [redacted] weeks gestation of pregnancy Tdap given today  3. Supervision of high risk pregnancy, antepartum - Blood Pressure Monitoring (BLOOD PRESSURE KIT) DEVI; 1 Device by Does not apply route once for 1 dose.  Dispense: 1 each; Refill: 0  Preterm labor symptoms and general  obstetric precautions including but not limited to vaginal bleeding, contractions, leaking of fluid and fetal movement were reviewed in detail with the patient. Please refer to After Visit Summary for other counseling recommendations.   Return in about 1 week (around 10/02/2019) for OFFICE HOB Visit, 17P, Pelvic cultures (Kinyarwanda speaking).  No future appointments.  UgVerita SchneidersMD

## 2019-10-02 ENCOUNTER — Ambulatory Visit (INDEPENDENT_AMBULATORY_CARE_PROVIDER_SITE_OTHER): Payer: Medicaid Other | Admitting: Family Medicine

## 2019-10-02 ENCOUNTER — Encounter: Payer: Self-pay | Admitting: Family Medicine

## 2019-10-02 ENCOUNTER — Other Ambulatory Visit: Payer: Self-pay

## 2019-10-02 ENCOUNTER — Other Ambulatory Visit (HOSPITAL_COMMUNITY)
Admission: RE | Admit: 2019-10-02 | Discharge: 2019-10-02 | Disposition: A | Discharge status: Home / Self Care | Payer: Medicaid Other | Source: Ambulatory Visit | Attending: Family Medicine | Admitting: Family Medicine

## 2019-10-02 VITALS — BP 106/65 | HR 83 | Wt 162.8 lb

## 2019-10-02 DIAGNOSIS — Z3A36 36 weeks gestation of pregnancy: Secondary | ICD-10-CM | POA: Diagnosis not present

## 2019-10-02 DIAGNOSIS — O0990 Supervision of high risk pregnancy, unspecified, unspecified trimester: Secondary | ICD-10-CM

## 2019-10-02 DIAGNOSIS — O0993 Supervision of high risk pregnancy, unspecified, third trimester: Secondary | ICD-10-CM | POA: Diagnosis not present

## 2019-10-02 DIAGNOSIS — Z789 Other specified health status: Secondary | ICD-10-CM

## 2019-10-02 DIAGNOSIS — O09213 Supervision of pregnancy with history of pre-term labor, third trimester: Secondary | ICD-10-CM | POA: Diagnosis not present

## 2019-10-02 DIAGNOSIS — O09899 Supervision of other high risk pregnancies, unspecified trimester: Secondary | ICD-10-CM

## 2019-10-02 MED ORDER — HYDROXYPROGESTERONE CAPROATE 275 MG/1.1ML ~~LOC~~ SOAJ
275.0000 mg | Freq: Once | SUBCUTANEOUS | Status: AC
  Administered 2019-10-02: 275 mg via SUBCUTANEOUS

## 2019-10-02 NOTE — Patient Instructions (Signed)

## 2019-10-02 NOTE — Addendum Note (Signed)
Addended by: Maxwell Marion E on: 10/02/2019 11:58 AM   Modules accepted: Orders

## 2019-10-02 NOTE — Progress Notes (Signed)
   PRENATAL VISIT NOTE  Subjective:  Kim Blanchard is a 24 y.o. G2P0101 at [redacted]w[redacted]d being seen today for ongoing prenatal care.  [name] for the following issues for this low-risk pregnancy and has Language barrier affecting health care; Supervision of high risk pregnancy, antepartum; History of preterm delivery, currently pregnant; and UTI in pregnancy on their problem list.  Patient reports no complaints.  Contractions: Not present. Vag. Bleeding: None.  Movement: Present. Denies leaking of fluid.   The following portions of the patient's history were reviewed and updated as appropriate: allergies, current medications, past family history, past medical history, past social history, past surgical history and problem list.   Objective:   Vitals:   10/02/19 1000  BP: 106/65  Pulse: 83  Weight: 162 lb 12.8 oz (73.8 kg)    Fetal Status: Fetal Heart Rate (bpm): 136 Fundal Height: 35 cm Movement: Present  Presentation: Vertex  General:  Alert, oriented and cooperative. Patient is in no acute distress.  Skin: Skin is warm and dry. No rash noted.   Cardiovascular: Normal heart rate noted  Respiratory: Normal respiratory effort, no problems with respiration noted  Abdomen: Soft, gravid, appropriate for gestational age.  Pain/Pressure: Absent     Pelvic: Cervical exam performed in the presence of a chaperone Dilation: 1 Effacement (%): 10 Station: -3  Extremities: Normal range of motion.  Edema: None  Mental Status: Normal mood and affect. Normal behavior. Normal judgment and thought content.   Assessment and Plan:  Pregnancy: G2P0101 at [redacted]w[redacted]d 1. Supervision of high risk pregnancy, antepartum Continue routine prenatal care. Cultures today - Strep Gp B NAA - Cervicovaginal ancillary only( )  2. History of preterm delivery, currently pregnant Last 17 P given today  3. Language barrier affecting health care Speaks English, declined interpreter  today  Preterm labor symptoms and general obstetric precautions including but not limited to vaginal bleeding, contractions, leaking of fluid and fetal movement were reviewed in detail with the patient. Please refer to After Visit Summary for other counseling recommendations.   Return in 1 week (on 10/09/2019) for in person langauage barrier, [facility].  No future appointments.  Reva Bores, MD

## 2019-10-04 LAB — STREP GP B NAA: Strep Gp B NAA: POSITIVE — AB

## 2019-10-05 ENCOUNTER — Encounter: Payer: Self-pay | Admitting: Family Medicine

## 2019-10-05 DIAGNOSIS — O99820 Streptococcus B carrier state complicating pregnancy: Secondary | ICD-10-CM | POA: Insufficient documentation

## 2019-10-06 LAB — CERVICOVAGINAL ANCILLARY ONLY
Chlamydia: NEGATIVE
Comment: NEGATIVE
Comment: NORMAL
Neisseria Gonorrhea: NEGATIVE

## 2019-10-14 ENCOUNTER — Encounter: Payer: Medicaid Other | Admitting: Nurse Practitioner

## 2019-10-21 ENCOUNTER — Other Ambulatory Visit: Payer: Self-pay

## 2019-10-21 ENCOUNTER — Ambulatory Visit (INDEPENDENT_AMBULATORY_CARE_PROVIDER_SITE_OTHER): Payer: Medicaid Other | Admitting: Women's Health

## 2019-10-21 VITALS — BP 112/72 | HR 75 | Wt 164.6 lb

## 2019-10-21 DIAGNOSIS — O99820 Streptococcus B carrier state complicating pregnancy: Secondary | ICD-10-CM

## 2019-10-21 DIAGNOSIS — O0990 Supervision of high risk pregnancy, unspecified, unspecified trimester: Secondary | ICD-10-CM

## 2019-10-21 DIAGNOSIS — Z8751 Personal history of pre-term labor: Secondary | ICD-10-CM

## 2019-10-21 DIAGNOSIS — Z3A38 38 weeks gestation of pregnancy: Secondary | ICD-10-CM

## 2019-10-21 DIAGNOSIS — O2343 Unspecified infection of urinary tract in pregnancy, third trimester: Secondary | ICD-10-CM

## 2019-10-21 DIAGNOSIS — Z789 Other specified health status: Secondary | ICD-10-CM

## 2019-10-21 DIAGNOSIS — Z603 Acculturation difficulty: Secondary | ICD-10-CM

## 2019-10-21 DIAGNOSIS — Z3009 Encounter for other general counseling and advice on contraception: Secondary | ICD-10-CM

## 2019-10-21 NOTE — Progress Notes (Addendum)
Subjective:  Kim Blanchard is a 24 y.o. G2P0101 at [redacted]w[redacted]d being seen today for ongoing prenatal care.  [name] for the following issues for this low-risk pregnancy and has Language barrier affecting health care; Supervision of high risk pregnancy, antepartum; History of preterm delivery, currently pregnant; UTI in pregnancy; and Group B Streptococcus carrier, +RV culture, currently pregnant on their problem list.  Patient reports no complaints.  Contractions: Not present. Vag. Bleeding: None.  Movement: Present. Denies leaking of fluid.   The following portions of the patient's history were reviewed and updated as appropriate: allergies, current medications, past family history, past medical history, past social history, past surgical history and problem list. Problem list updated.  Objective:   Vitals:   10/21/19 1122  BP: 112/72  Pulse: 75  Weight: 164 lb 9.6 oz (74.7 kg)    Fetal Status: Fetal Heart Rate (bpm): 143 Fundal Height: 39 cm Movement: Present     General:  Alert, oriented and cooperative. Patient is in no acute distress.  Skin: Skin is warm and dry. No rash noted.   Cardiovascular: Normal heart rate noted  Respiratory: Normal respiratory effort, no problems with respiration noted  Abdomen: Soft, gravid, appropriate for gestational age. Pain/Pressure: Absent     Pelvic: Vag. Bleeding: None     Cervical exam deferred        Extremities: Normal range of motion.  Edema: None  Mental Status: Normal mood and affect. Normal behavior. Normal judgment and thought content.   Urinalysis:      Assessment and Plan:  Pregnancy: G2P0101 at [redacted]w[redacted]d  1. Supervision of high risk pregnancy, antepartum - discussed contraception, pt elects no birth control  2. Group B Streptococcus carrier, +RV culture, currently pregnant - treat in labor, pt confirms no allergies  3. Language barrier affecting health care - Kinyarwanda interpreter present for entire visit  4.  Urinary tract infection in mother during third trimester of pregnancy - +UTI 03/2019, no TOC performed, will do today   Term labor symptoms and general obstetric precautions including but not limited to vaginal bleeding, contractions, leaking of fluid and fetal movement were reviewed in detail with the patient. Please refer to After Visit Summary for other counseling recommendations. I discussed the assessment and treatment plan with the patient. The patient was provided an opportunity to ask questions and all were answered. The patient agreed with the plan and demonstrated an understanding of the instructions. The patient was advised to call back or seek an in-person office evaluation/go to MAU at [facility] for any urgent or concerning symptoms.  Return in about 1 week (around 10/28/2019) for in-person LOB/APP OK.   Jamela Cumbo, Odie Sera, NP

## 2019-10-21 NOTE — Patient Instructions (Signed)
Maternity Assessment Unit (MAU)  The Maternity Assessment Unit (MAU) is located at the [facility]. The address is: [address], [place], [place], [place] [address]. Please see map below for additional directions.    The Maternity Assessment Unit is designed to help you during your pregnancy, and for up to 6 weeks after delivery, with any pregnancy- or postpartum-related emergencies, if you think you are in labor, or if your water has broken. For example, if you experience nausea and vomiting, vaginal bleeding, severe abdominal or pelvic pain, elevated blood pressure or other problems related to your pregnancy or postpartum time, please come to the Maternity Assessment Unit for assistance.        Signs and Symptoms of Labor Labor is your body's natural process of moving your baby, placenta, and umbilical cord out of your uterus. The process of labor usually starts when your baby is full-term, between 23 and 40 weeks of pregnancy. How will I know when I am close to going into labor? As your body prepares for labor and the birth of your baby, you may notice the following symptoms in the weeks and days before true labor starts:  Having a strong desire to get your home ready to receive your new baby. This is called nesting. Nesting may be a sign that labor is approaching, and it may occur several weeks before birth. Nesting may involve cleaning and organizing your home.  Passing a small amount of thick, bloody mucus out of your vagina (normal bloody show or losing your mucus plug). This may happen more than a week before labor begins, or it might occur right before labor begins as the opening of the cervix starts to widen (dilate). For some women, the entire mucus plug passes at once. For others, smaller portions of the mucus plug may gradually pass over several days.  Your baby moving (dropping) lower in your pelvis to get into position for birth  (lightening). When this happens, you may feel more pressure on your bladder and pelvic bone and less pressure on your ribs. This may make it easier to breathe. It may also cause you to need to urinate more often and have problems with bowel movements.  Having "practice contractions" (Braxton Hicks contractions) that occur at irregular (unevenly spaced) intervals that are more than 10 minutes apart. This is also called false labor. False labor contractions are common after exercise or sexual activity, and they will stop if you change position, rest, or drink fluids. These contractions are usually mild and do not get stronger over time. They may feel like: ? A backache or back pain. ? Mild cramps, similar to menstrual cramps. ? Tightening or pressure in your abdomen. Other early symptoms that labor may be starting soon include:  Nausea or loss of appetite.  Diarrhea.  Having a sudden burst of energy, or feeling very tired.  Mood changes.  Having trouble sleeping. How will I know when labor has begun? Signs that true labor has begun may include:  Having contractions that come at regular (evenly spaced) intervals and increase in intensity. This may feel like more intense tightening or pressure in your abdomen that moves to your back. ? Contractions may also feel like rhythmic pain in your upper thighs or back that comes and goes at regular intervals. ? For first-time mothers, this change in intensity of contractions often occurs at a more gradual pace. ? Women who have given birth before may notice  a more rapid progression of contraction changes.  Having a feeling of pressure in the vaginal area.  Your water breaking (rupture of membranes). This is when the sac of fluid that surrounds your baby breaks. When this happens, you will notice fluid leaking from your vagina. This may be clear or blood-tinged. Labor usually starts within 24 hours of your water breaking, but it may take longer to  begin. ? Some women notice this as a gush of fluid. ? Others notice that their underwear repeatedly becomes damp. Follow these instructions at home:   When labor starts, or if your water breaks, call your health care provider or nurse care line. Based on your situation, they will determine when you should go in for an exam.  When you are in early labor, you may be able to rest and manage symptoms at home. Some strategies to try at home include: ? Breathing and relaxation techniques. ? Taking a warm bath or shower. ? Listening to music. ? Using a heating pad on the lower back for pain. If you are directed to use heat:  Place a towel between your skin and the heat source.  Leave the heat on for 20-30 minutes.  Remove the heat if your skin turns bright red. This is especially important if you are unable to feel pain, heat, or cold. You may have a greater risk of getting burned. Get help right away if:  You have painful, regular contractions that are 5 minutes apart or less.  Labor starts before you are [redacted] weeks along in your pregnancy.  You have a fever.  You have a headache that does not go away.  You have bright red blood coming from your vagina.  You do not feel your baby moving.  You have a sudden onset of: ? Severe headache with vision problems. ? Nausea, vomiting, or diarrhea. ? Chest pain or shortness of breath. These symptoms may be an emergency. If your health care provider recommends that you go to the hospital or birth center where you plan to deliver, do not drive yourself. Have someone else drive you, or call emergency services (911 in the [place] Summary  Labor is your body's natural process of moving your baby, placenta, and umbilical cord out of your uterus.  The process of labor usually starts when your baby is full-term, between 94 and 40 weeks of pregnancy.  When labor starts, or if your water breaks, call your health care provider or nurse care line. Based  on your situation, they will determine when you should go in for an exam. This information is not intended to replace advice given to you by your health care provider. Make sure you discuss any questions you have with your health care provider. Document Revised: 12/17/2016 Document Reviewed: 08/24/2016 Elsevier Patient Education  2020 [employer] Inc[employer]        Group B Streptococcus Infection During Pregnancy Group B Streptococcus (GBS) is a type of bacteria that is often found in healthy people. It is commonly found in the rectum, vagina, and intestines. In people who are healthy and not pregnant, the bacteria rarely cause serious illness or complications. However, women who test positive for GBS during pregnancy can pass the bacteria to the baby during childbirth. This can cause serious infection in the baby after birth. Women with GBS may also have infections during their pregnancy or soon after childbirth. The infections include urinary tract infections (UTIs) or infections of the uterus. GBS also increases a woman's  risk of complications during pregnancy, such as early labor or delivery, miscarriage, or stillbirth. Routine testing for GBS is recommended for all pregnant women. What are the causes? This condition is caused by bacteria called Streptococcus agalactiae. What increases the risk? You may have a higher risk for GBS infection during pregnancy if you had one during a past pregnancy. What are the signs or symptoms? In most cases, GBS infection does not cause symptoms in pregnant women. If symptoms exist, they may include:  Labor that starts before the 37th week of pregnancy.  A UTI or bladder infection. This may cause a fever, frequent urination, or pain and burning during urination.  Fever during labor. There can also be a rapid heartbeat in the mother or baby. Rare but serious symptoms of a GBS infection in women include:  Blood infection (septicemia). This may cause fever,  chills, or confusion.  Lung infection (pneumonia). This may cause fever, chills, cough, rapid breathing, chest pain, or difficulty breathing.  Bone, joint, skin, or soft tissue infection. How is this diagnosed? You may be screened for GBS between week 35 and week 37 of pregnancy. If you have symptoms of preterm labor, you may be screened earlier. This condition is diagnosed based on lab test results from:  A swab of fluid from the vagina and rectum.  A urine sample. How is this treated? This condition is treated with antibiotic medicine. Antibiotic medicine may be given:  To you when you go into labor, or as soon as your water breaks. The medicines will continue until after you give birth. If you are having a cesarean delivery, you do not need antibiotics unless your water has broken.  To your baby, if he or she requires treatment. Your health care provider will check your baby to decide if he or she needs antibiotics to prevent a serious infection. Follow these instructions at home:  Take over-the-counter and prescription medicines only as told by your health care provider.  Take your antibiotic medicine as told by your health care provider. Do not stop taking the antibiotic even if you start to feel better.  Keep all pre-birth (prenatal) visits and follow-up visits as told by your health care provider. This is important. Contact a health care provider if:  You have pain or burning when you urinate.  You have to urinate more often than usual.  You have a fever or chills.  You develop a bad-smelling vaginal discharge. Get help right away if:  Your water breaks.  You go into labor.  You have severe pain in your abdomen.  You have difficulty breathing.  You have chest pain. These symptoms may represent a serious problem that is an emergency. Do not wait to see if the symptoms will go away. Get medical help right away. Call your local emergency services (911 in the [place] Do  not drive yourself to the hospital. Summary  GBS is a type of bacteria that is common in healthy people.  During pregnancy, colonization with GBS can cause serious complications for you or your baby.  Your health care provider will screen you between 35 and 37 weeks of pregnancy to determine if you are colonized with GBS.  If you are colonized with GBS during pregnancy, your health care provider will recommend antibiotics through an IV during labor.  After delivery, your baby will be evaluated for complications related to potential GBS infection and may require antibiotics to prevent a serious infection. This information is not intended to replace  advice given to you by your health care provider. Make sure you discuss any questions you have with your health care provider. Document Revised: 10/13/2018 Document Reviewed: 10/13/2018 Elsevier Patient Education  2020 [employer].

## 2019-10-28 ENCOUNTER — Other Ambulatory Visit: Payer: Self-pay | Admitting: Obstetrics and Gynecology

## 2019-10-28 ENCOUNTER — Ambulatory Visit (INDEPENDENT_AMBULATORY_CARE_PROVIDER_SITE_OTHER): Payer: Medicaid Other | Admitting: Obstetrics and Gynecology

## 2019-10-28 ENCOUNTER — Other Ambulatory Visit: Payer: Self-pay

## 2019-10-28 VITALS — BP 122/85 | HR 102 | Wt 164.0 lb

## 2019-10-28 DIAGNOSIS — O0990 Supervision of high risk pregnancy, unspecified, unspecified trimester: Secondary | ICD-10-CM

## 2019-10-28 DIAGNOSIS — O99820 Streptococcus B carrier state complicating pregnancy: Secondary | ICD-10-CM

## 2019-10-28 DIAGNOSIS — O09213 Supervision of pregnancy with history of pre-term labor, third trimester: Secondary | ICD-10-CM

## 2019-10-28 DIAGNOSIS — Z789 Other specified health status: Secondary | ICD-10-CM

## 2019-10-28 DIAGNOSIS — Z603 Acculturation difficulty: Secondary | ICD-10-CM

## 2019-10-28 DIAGNOSIS — Z3A39 39 weeks gestation of pregnancy: Secondary | ICD-10-CM

## 2019-10-28 NOTE — Progress Notes (Signed)
   PRENATAL VISIT NOTE  Subjective:  Kim Blanchard is a 24 y.o. G2P0101 at [redacted]w[redacted]d being seen today for ongoing prenatal care.  [name] for the following issues for this high-risk pregnancy and has Language barrier affecting health care; Supervision of high risk pregnancy, antepartum; History of preterm delivery, currently pregnant; UTI in pregnancy; and Group B Streptococcus carrier, +RV culture, currently pregnant on their problem list.  Patient reports contractions since yesterday. .  Contractions: Irritability. Vag. Bleeding: None.  Movement: Present. Denies leaking of fluid.   The following portions of the patient's history were reviewed and updated as appropriate: allergies, current medications, past family history, past medical history, past social history, past surgical history and problem list.   Objective:   Vitals:   10/28/19 1134  BP: 122/85  Pulse: 102  Weight: 164 lb (74.4 kg)    Fetal Status: Fetal Heart Rate (bpm): 130 Fundal Height: 38 cm Movement: Present  Presentation: Vertex  General:  Alert, oriented and cooperative. Patient is in no acute distress.  Skin: Skin is warm and dry. No rash noted.   Cardiovascular: Normal heart rate noted  Respiratory: Normal respiratory effort, no problems with respiration noted  Abdomen: Soft, gravid, appropriate for gestational age.  Pain/Pressure: Present     Pelvic: Cervical exam performed in the presence of a chaperone Dilation: 1.5 Effacement (%): 40 Station: -3  Extremities: Normal range of motion.  Edema: None  Mental Status: Normal mood and affect. Normal behavior. Normal judgment and thought content.   Assessment and Plan:  Pregnancy: G2P0101 at [redacted]w[redacted]d  1. Supervision of high risk pregnancy, antepartum  Induction orders placed for post dates Needs BPP and NST next visit   2. Group B Streptococcus carrier, +RV culture, currently pregnant   3. Language barrier affecting health care    Term labor  symptoms and general obstetric precautions including but not limited to vaginal bleeding, contractions, leaking of fluid and fetal movement were reviewed in detail with the patient. Please refer to After Visit Summary for other counseling recommendations.   Return for Needs post dates NST and BPP .  Future Appointments  Date Time Provider Department Center  11/04/2019 11:15 AM [facility] NST [facility]  11/04/2019  1:15 PM Sheila Oats, MD [facility]  11/06/2019  8:45 AM [facility] SCHED ROOM [facility] None    Venia Carbon, NP

## 2019-10-29 ENCOUNTER — Other Ambulatory Visit: Payer: Self-pay

## 2019-10-29 ENCOUNTER — Encounter (HOSPITAL_COMMUNITY): Payer: Self-pay | Admitting: Obstetrics and Gynecology

## 2019-10-29 ENCOUNTER — Inpatient Hospital Stay (HOSPITAL_COMMUNITY)
Admission: EM | Admit: 2019-10-29 | Discharge: 2019-10-29 | Disposition: A | Discharge status: Home / Self Care | Payer: Medicaid Other | Attending: Obstetrics and Gynecology | Admitting: Obstetrics and Gynecology

## 2019-10-29 DIAGNOSIS — O09213 Supervision of pregnancy with history of pre-term labor, third trimester: Secondary | ICD-10-CM | POA: Diagnosis not present

## 2019-10-29 DIAGNOSIS — Z79899 Other long term (current) drug therapy: Secondary | ICD-10-CM | POA: Insufficient documentation

## 2019-10-29 DIAGNOSIS — M549 Dorsalgia, unspecified: Secondary | ICD-10-CM

## 2019-10-29 DIAGNOSIS — O26893 Other specified pregnancy related conditions, third trimester: Secondary | ICD-10-CM | POA: Diagnosis not present

## 2019-10-29 DIAGNOSIS — M545 Low back pain: Secondary | ICD-10-CM | POA: Insufficient documentation

## 2019-10-29 DIAGNOSIS — Z3A39 39 weeks gestation of pregnancy: Secondary | ICD-10-CM | POA: Insufficient documentation

## 2019-10-29 LAB — URINALYSIS, ROUTINE W REFLEX MICROSCOPIC
Bilirubin Urine: NEGATIVE
Glucose, UA: NEGATIVE mg/dL
Hgb urine dipstick: NEGATIVE
Ketones, ur: NEGATIVE mg/dL
Leukocytes,Ua: NEGATIVE
Nitrite: NEGATIVE
Protein, ur: NEGATIVE mg/dL
Specific Gravity, Urine: 1.006 (ref 1.005–1.030)
pH: 7 (ref 5.0–8.0)

## 2019-10-29 MED ORDER — CYCLOBENZAPRINE HCL 5 MG PO TABS
10.0000 mg | ORAL_TABLET | Freq: Once | ORAL | Status: AC
  Administered 2019-10-29: 10 mg via ORAL
  Filled 2019-10-29: qty 2

## 2019-10-29 MED ORDER — CYCLOBENZAPRINE HCL 10 MG PO TABS
10.0000 mg | ORAL_TABLET | Freq: Every day | ORAL | 0 refills | Status: DC
Start: 2019-10-29 — End: 2021-02-09

## 2019-10-29 NOTE — ED Provider Notes (Signed)
[facility] DEPT [facility] Emergency Department Provider Note MRN:  [id number]  Arrival date & time: 10/29/19     Chief Complaint   Back Pain   History of Present Illness   Kim Blanchard is a 24 y.o. year-old female with a history of preterm labor presenting to the ED with chief complaint of back pain.  Cramp-like back pain that began this afternoon, constant, severe, feels like possible contractions.  She is 9 months pregnant and her due date is "soon".  History obtained largely from significant other as patient does not speak [place].  No bleeding, no leakage of fluid, no other complaints  Review of Systems  A complete 10 system review of systems was obtained and all systems are negative except as noted in the HPI and PMH.   Patient's Health History    Past Medical History:  Diagnosis Date   Preterm labor     No past surgical history on file.  No family history on file.  Social History   Socioeconomic History   Marital status: Single    Spouse name: Not on file   Number of children: Not on file   Years of education: Not on file   Highest education level: Not on file  Occupational History   Not on file  Tobacco Use   Smoking status: Never Smoker   Smokeless tobacco: Never Used  Vaping Use   Vaping Use: Never used  Substance and Sexual Activity   Alcohol use: Never   Drug use: Never   Sexual activity: Yes    Birth control/protection: None  Other Topics Concern   Not on file  Social History Narrative   Not on file   Social Determinants of Health   Financial Resource Strain:    Difficulty of Paying Living Expenses:   Food Insecurity: No Food Insecurity   Worried About Running Out of Food in the Last Year: Never true   Ran Out of Food in the Last Year: Never true  Transportation Needs: No Transportation Needs   Lack of Transportation (Medical): No   Lack of Transportation (Non-Medical): No  Physical Activity:    Days of  Exercise per Week:    Minutes of Exercise per Session:   Stress:    Feeling of Stress :   Social Connections:    Frequency of Communication with Friends and Family:    Frequency of Social Gatherings with Friends and Family:    Attends Religious Services:    Active Member of Clubs or Organizations:    Attends Banker Meetings:    Marital Status:   Intimate Partner Violence:    Fear of Current or Ex-Partner:    Emotionally Abused:    Physically Abused:    Sexually Abused:      Physical Exam   Vitals:   10/29/19 1418 10/29/19 1420  BP: (!) 125/92   Pulse: 82   Resp: 20   Temp:  99 F (37.2 C)  SpO2: 99%     CONSTITUTIONAL: Well-appearing, NAD NEURO:  Alert and oriented x 3, no focal deficits EYES:  eyes equal and reactive ENT/NECK:  no LAD, no JVD CARDIO: Regular rate, well-perfused, normal S1 and S2 PULM:  CTAB no wheezing or rhonchi GI/GU:  normal bowel sounds, non-distended, non-tender; normal appearing external genitalia, high station, unable to palpate neonatal head or cervix MSK/SPINE:  No gross deformities, no edema SKIN:  no rash, atraumatic PSYCH:  Appropriate speech and behavior  *Additional and/or pertinent findings included in  MDM below  Diagnostic and Interventional Summary    EKG Interpretation  Date/Time:    Ventricular Rate:    PR Interval:    QRS Duration:   QT Interval:    QTC Calculation:   R Axis:     Text Interpretation:        Labs Reviewed - No data to display  No orders to display    Medications - No data to display   Procedures  /  Critical Care Procedures  ED Course and Medical Decision Making  I have reviewed the triage vital signs, the nursing notes, and pertinent available records from the EMR.  Listed above are laboratory and imaging tests that I personally ordered, reviewed, and interpreted and then considered in my medical decision making (see below for details).      Cramping back pain,  question of active labor versus mild contractions.  No leakage of fluid, no vaginal bleeding, well-appearing, normal vital signs, exam is reassuring, nothing to suggest imminent delivery, stable for transport to the MAU.    Elmer Sow. Pilar Plate, MD [facility] [email or web address]  Final Clinical Impressions(s) / ED Diagnoses     ICD-10-CM   1. Acute low back pain, unspecified back pain laterality, unspecified whether sciatica present  M54.5   2. Pregnancy, unspecified gestational age  Z72.90     ED Discharge Orders    None       Discharge Instructions Discussed with and Provided to Patient:   Discharge Instructions   None       Sabas Sous, MD 10/29/19 1421

## 2019-10-29 NOTE — Discharge Instructions (Signed)
Back Pain in Pregnancy Back pain during pregnancy is common. Back pain may be caused by several factors that are related to changes during your pregnancy. Follow these instructions at home: Managing pain, stiffness, and swelling      If directed, for sudden (acute) back pain, put ice on the painful area. ? Put ice in a plastic bag. ? Place a towel between your skin and the bag. ? Leave the ice on for 20 minutes, 2-3 times per day.  If directed, apply heat to the affected area before you exercise. Use the heat source that your health care provider recommends, such as a moist heat pack or a heating pad. ? Place a towel between your skin and the heat source. ? Leave the heat on for 20-30 minutes. ? Remove the heat if your skin turns bright red. This is especially important if you are unable to feel pain, heat, or cold. You may have a greater risk of getting burned.  If directed, massage the affected area. Activity  Exercise as told by your health care provider. Gentle exercise is the best way to prevent or manage back pain.  Listen to your body when lifting. If lifting hurts, ask for help or bend your knees. This uses your leg muscles instead of your back muscles.  Squat down when picking up something from the floor. Do not bend over.  Only use bed rest for short periods as told by your health care provider. Bed rest should only be used for the most severe episodes of back pain. Standing, sitting, and lying down  Do not stand in one place for long periods of time.  Use good posture when sitting. Make sure your head rests over your shoulders and is not hanging forward. Use a pillow on your lower back if necessary.  Try sleeping on your side, preferably the left side, with a pregnancy support pillow or 1-2 regular pillows between your legs. ? If you have back pain after a night's rest, your bed may be too soft. ? A firm mattress may provide more support for your back during  pregnancy. General instructions  Do not wear high heels.  Eat a healthy diet. Try to gain weight within your health care provider's recommendations.  Use a maternity girdle, elastic sling, or back brace as told by your health care provider.  Take over-the-counter and prescription medicines only as told by your health care provider.  Work with a physical therapist or massage therapist to find ways to manage back pain. Acupuncture or massage therapy may be helpful.  Keep all follow-up visits as told by your health care provider. This is important. Contact a health care provider if:  Your back pain interferes with your daily activities.  You have increasing pain in other parts of your body. Get help right away if:  You develop numbness, tingling, weakness, or problems with the use of your arms or legs.  You develop severe back pain that is not controlled with medicine.  You have a change in bowel or bladder control.  You develop shortness of breath, dizziness, or you faint.  You develop nausea, vomiting, or sweating.  You have back pain that is a rhythmic, cramping pain similar to labor pains. Labor pain is usually 1-2 minutes apart, lasts for about 1 minute, and involves a bearing down feeling or pressure in your pelvis.  You have back pain and your water breaks or you have vaginal bleeding.  You have back pain or numbness  that travels down your leg.  Your back pain developed after you fell.  You develop pain on one side of your back.  You see blood in your urine.  You develop skin blisters in the area of your back pain. Summary  Back pain may be caused by several factors that are related to changes during your pregnancy.  Follow instructions as told by your health care provider for managing pain, stiffness, and swelling.  Exercise as told by your health care provider. Gentle exercise is the best way to prevent or manage back pain.  Take over-the-counter and  prescription medicines only as told by your health care provider.  Keep all follow-up visits as told by your health care provider. This is important. This information is not intended to replace advice given to you by your health care provider. Make sure you discuss any questions you have with your health care provider. Document Revised: 07/08/2018 Document Reviewed: 09/04/2017 Elsevier Patient Education  2020 Elsevier Inc. Fetal Movement Counts Patient Name: ________________________________________________ Patient Due Date: ____________________ What is a fetal movement count?  A fetal movement count is the number of times that you feel your baby move during a certain amount of time. This may also be called a fetal kick count. A fetal movement count is recommended for every pregnant woman. You may be asked to start counting fetal movements as early as week 28 of your pregnancy. Pay attention to when your baby is most active. You may notice your baby's sleep and wake cycles. You may also notice things that make your baby move more. You should do a fetal movement count:  When your baby is normally most active.  At the same time each day. A good time to count movements is while you are resting, after having something to eat and drink. How do I count fetal movements? 1. Find a quiet, comfortable area. Sit, or lie down on your side. 2. Write down the date, the start time and stop time, and the number of movements that you felt between those two times. Take this information with you to your health care visits. 3. Write down your start time when you feel the first movement. 4. Count kicks, flutters, swishes, rolls, and jabs. You should feel at least 10 movements. 5. You may stop counting after you have felt 10 movements, or if you have been counting for 2 hours. Write down the stop time. 6. If you do not feel 10 movements in 2 hours, contact your health care provider for further instructions. Your  health care provider may want to do additional tests to assess your baby's well-being. Contact a health care provider if:  You feel fewer than 10 movements in 2 hours.  Your baby is not moving like he or she usually does. Date: ____________ Start time: ____________ Stop time: ____________ Movements: ____________ Date: ____________ Start time: ____________ Stop time: ____________ Movements: ____________ Date: ____________ Start time: ____________ Stop time: ____________ Movements: ____________ Date: ____________ Start time: ____________ Stop time: ____________ Movements: ____________ Date: ____________ Start time: ____________ Stop time: ____________ Movements: ____________ Date: ____________ Start time: ____________ Stop time: ____________ Movements: ____________ Date: ____________ Start time: ____________ Stop time: ____________ Movements: ____________ Date: ____________ Start time: ____________ Stop time: ____________ Movements: ____________ Date: ____________ Start time: ____________ Stop time: ____________ Movements: ____________ This information is not intended to replace advice given to you by your health care provider. Make sure you discuss any questions you have with your health care provider. Document Revised:   11/06/2018 Document Reviewed: 11/06/2018 Elsevier Patient Education  2020 Elsevier Inc. [employer] of the uterus can occur throughout pregnancy, but they are not always a sign that you are in labor. You may have practice contractions called Braxton Hicks contractions. These false labor contractions are sometimes confused with true labor. What are Deberah Pelton contractions? Braxton Hicks contractions are tightening movements that occur in the muscles of the uterus before labor. Unlike true labor contractions, these contractions do not result in opening (dilation) and thinning of the cervix. Toward the end of pregnancy (32-34 weeks), Braxton Hicks  contractions can happen more often and may become stronger. These contractions are sometimes difficult to tell apart from true labor because they can be very uncomfortable. You should not feel embarrassed if you go to the hospital with false labor. Sometimes, the only way to tell if you are in true labor is for your health care provider to look for changes in the cervix. The health care provider will do a physical exam and may monitor your contractions. If you are not in true labor, the exam should show that your cervix is not dilating and your water has not broken. If there are no other health problems associated with your pregnancy, it is completely safe for you to be sent home with false labor. You may continue to have Braxton Hicks contractions until you go into true labor. How to tell the difference between true labor and false labor True labor  Contractions last 30-70 seconds.  Contractions become very regular.  Discomfort is usually felt in the top of the uterus, and it spreads to the lower abdomen and low back.  Contractions do not go away with walking.  Contractions usually become more intense and increase in frequency.  The cervix dilates and gets thinner. False labor  Contractions are usually shorter and not as strong as true labor contractions.  Contractions are usually irregular.  Contractions are often felt in the front of the lower abdomen and in the groin.  Contractions may go away when you walk around or change positions while lying down.  Contractions get weaker and are shorter-lasting as time goes on.  The cervix usually does not dilate or become thin. Follow these instructions at home:   Take over-the-counter and prescription medicines only as told by your health care provider.  Keep up with your usual exercises and follow other instructions from your health care provider.  Eat and drink lightly if you think you are going into labor.  If Braxton Hicks  contractions are making you uncomfortable: ? Change your position from lying down or resting to walking, or change from walking to resting. ? Sit and rest in a tub of warm water. ? Drink enough fluid to keep your urine pale yellow. Dehydration may cause these contractions. ? Do slow and deep breathing several times an hour.  Keep all follow-up prenatal visits as told by your health care provider. This is important. Contact a health care provider if:  You have a fever.  You have continuous pain in your abdomen. Get help right away if:  Your contractions become stronger, more regular, and closer together.  You have fluid leaking or gushing from your vagina.  You pass blood-tinged mucus (bloody show).  You have bleeding from your vagina.  You have low back pain that you never had before.  You feel your baby's head pushing down and causing pelvic pressure.  Your baby is not moving inside you as much  as it used to. Summary  Contractions that occur before labor are called Braxton Hicks contractions, false labor, or practice contractions.  Braxton Hicks contractions are usually shorter, weaker, farther apart, and less regular than true labor contractions. True labor contractions usually become progressively stronger and regular, and they become more frequent.  Manage discomfort from [facility] contractions by changing position, resting in a warm bath, drinking plenty of water, or practicing deep breathing. This information is not intended to replace advice given to you by your health care provider. Make sure you discuss any questions you have with your health care provider. Document Revised: 03/01/2017 Document Reviewed: 08/02/2016 Elsevier Patient Education  2020 [employer].

## 2019-10-29 NOTE — MAU Provider Note (Addendum)
History     CSN: [id number]  Arrival date and time: 10/29/19 1406   First Provider Initiated Contact with Patient 10/29/19 1519      Chief Complaint  Patient presents with  . Back Pain   HPI Ms. Kim Blanchard is a 24 y.o. G2P0101 at [redacted]w[redacted]d who presents to MAU today with complaint of mid to low back pain since this morning. She also states occasional lower pelvic pain today. She rates pain at 7/10. Pain is constant and she has not taken anything for pain. She states some contractions, but irregular. She denies vaginal bleeding, LOF, UTI symptoms or fever. She reports normal fetal movement.   OB History    Gravida  2   Para  1   Term      Preterm  1   AB      Living  1     SAB      TAB      Ectopic      Multiple  0   Live Births  1           Past Medical History:  Diagnosis Date  . Preterm labor     History reviewed. No pertinent surgical history.  History reviewed. No pertinent family history.  Social History   Tobacco Use  . Smoking status: Never Smoker  . Smokeless tobacco: Never Used  Vaping Use  . Vaping Use: Never used  Substance Use Topics  . Alcohol use: Never  . Drug use: Never    Allergies: No Known Allergies  Medications Prior to Admission  Medication Sig Dispense Refill Last Dose  . Prenatal Vit-DSS-Fe Fum-FA (PRENATAL 19) 29-1 MG TABS Take 1 tablet by mouth daily. (Patient not taking: Reported on 10/29/2019) 30 tablet 11 Not Taking at Unknown time    Review of Systems  Constitutional: Negative for fever.  Gastrointestinal: Positive for abdominal pain. Negative for constipation, diarrhea, nausea and vomiting.  Genitourinary: Negative for dysuria, frequency, urgency, vaginal bleeding and vaginal discharge.  Musculoskeletal: Positive for back pain.   Physical Exam   Blood pressure 110/68, pulse 71, temperature 98.2 F (36.8 C), temperature source Oral, resp. rate 16, last menstrual period 01/23/2019, SpO2 100 %, unknown if  currently breastfeeding.  Physical Exam Vitals and nursing note reviewed.  Constitutional:      General: She is not in acute distress.    Appearance: She is well-developed.  HENT:     Head: Normocephalic and atraumatic.  Cardiovascular:     Rate and Rhythm: Normal rate.  Pulmonary:     Effort: Pulmonary effort is normal.  Abdominal:     General: There is no distension.     Palpations: Abdomen is soft. There is no mass.     Tenderness: There is no abdominal tenderness. There is no guarding or rebound.  Skin:    General: Skin is warm and dry.     Findings: No erythema.  Neurological:     Mental Status: She is alert and oriented to person, place, and time.  Psychiatric:        Mood and Affect: Mood normal.    Cervical Exam:  Dilation: 1 Effacement (%): Thick Cervical Position: Posterior Station: Ballotable Presentation: Undeterminable Exam by:: Zenia Resides, RN  Results for orders placed or performed during the hospital encounter of 10/29/19 (from the past 24 hour(s))  Urinalysis, Routine w reflex microscopic     Status: None   Collection Time: 10/29/19  4:07 PM  Result Value Ref Range  Color, Urine YELLOW YELLOW   APPearance CLEAR CLEAR   Specific Gravity, Urine 1.006 1.005 - 1.030   pH 7.0 5.0 - 8.0   Glucose, UA NEGATIVE NEGATIVE mg/dL   Hgb urine dipstick NEGATIVE NEGATIVE   Bilirubin Urine NEGATIVE NEGATIVE   Ketones, ur NEGATIVE NEGATIVE mg/dL   Protein, ur NEGATIVE NEGATIVE mg/dL   Nitrite NEGATIVE NEGATIVE   Leukocytes,Ua NEGATIVE NEGATIVE    Fetal Monitoring: Baseline: 125 bpm Variability: moderate Accelerations: 15 x 15 Decelerations: none Contractions: few, irregular   MAU Course  Procedures None  MDM Patient arrived from the ED with IV in place, has received 1 L fluid UA today without concern for infection  10 mg Flexeril given. Patient reports significant improvement in pain.  Contractions slightly more frequent, but recheck of cervix is  unchanged.  Assessment and Plan  A: SIUP at [redacted]w[redacted]d Back pain in pregnancy, third trimester   P:  Discharge home Rx for Flexeril sent to patient's pharmacy  Labor precautions discussed Patient advised to follow-up with [facility] for routine prenatal care as scheduled Patient may return to MAU as needed or if her condition were to change or worsen  Vonzella Nipple, PA-C 10/29/2019, 4:36 PM

## 2019-10-29 NOTE — Progress Notes (Signed)
Was requested by [facility] to come and evaluate pt in Trauma A for potential labor candidate.  They had no information to give to the RROB on the patient in question.  Got to Trauma A and was told pt was to go to MAU.  FOB and patient does not speak [place].  Speaks [place]. Unable to assess prior to transporting to MAU. Phoned MAU to expect patient. To 124 via stretcher.  Monitors applied.  Interpreter obtained via video.  SVE 1/thick/ballotable.  No bleeding or leaking noted. Pt had come to [facility] for c/o back pain.  Has received [facility] with Faculty.

## 2019-10-29 NOTE — MAU Note (Signed)
Pt reports back pain that started this morning.   Denies vaginal bleeding  Denies LOF  Reports +FM

## 2019-10-29 NOTE — ED Triage Notes (Signed)
Pt arrives POV with husband with complaints of back pain. Pt 9 months pregnant

## 2019-10-29 NOTE — ED Notes (Signed)
Fetal heart tone 130

## 2019-10-30 ENCOUNTER — Other Ambulatory Visit: Payer: Self-pay

## 2019-10-30 ENCOUNTER — Encounter (HOSPITAL_COMMUNITY): Payer: Self-pay | Admitting: Obstetrics and Gynecology

## 2019-10-30 ENCOUNTER — Inpatient Hospital Stay (HOSPITAL_COMMUNITY)
Admission: AD | Admit: 2019-10-30 | Discharge: 2019-10-30 | Disposition: A | Discharge status: Home / Self Care | Payer: Medicaid Other | Attending: Obstetrics and Gynecology | Admitting: Obstetrics and Gynecology

## 2019-10-30 DIAGNOSIS — O480 Post-term pregnancy: Secondary | ICD-10-CM | POA: Insufficient documentation

## 2019-10-30 DIAGNOSIS — O368130 Decreased fetal movements, third trimester, not applicable or unspecified: Secondary | ICD-10-CM | POA: Insufficient documentation

## 2019-10-30 DIAGNOSIS — Z3A40 40 weeks gestation of pregnancy: Secondary | ICD-10-CM | POA: Diagnosis not present

## 2019-10-30 DIAGNOSIS — O471 False labor at or after 37 completed weeks of gestation: Secondary | ICD-10-CM | POA: Insufficient documentation

## 2019-10-30 NOTE — MAU Provider Note (Signed)
    S: Ms. Kim Blanchard is a 24 y.o. G2P0101 at [redacted]w[redacted]d  who presents to MAU today complaining of  irreg contractions and DFM earlier today. She denies vaginal bleeding. She denies LOF. Denies H/A, N/V or visual disturbances.    O: BP 118/81   Pulse 86   Temp 98 F (36.7 C) (Oral)   LMP 01/23/2019   SpO2 98%  GENERAL: Well-developed, well-nourished female in no acute distress.  HEAD: Normocephalic, atraumatic.  CHEST: Normal effort of breathing, regular heart rate ABDOMEN: Soft, nontender, gravid  Cervical exam:  Dilation: 2 Effacement (%): 40 Cervical Position: Middle Station: Ballotable Presentation: Undeterminable Exam by:: Lauren Cox RN    Fetal Monitoring: Cat 1 Baseline: 135-140 Variability: + variability Accelerations: +  Decelerations: none Contractions: irreg, mild FM: multiple sections of FM noted, as well as pt states she has been feeling the baby move during her NST   A: [facility]UP at [redacted]w[redacted]d  False labor  DFM with movement since arrival  P: Labor/ROM/bldg/FM precautions rev'd Keep BPP/NST/OB appt on 8/4 and IOL 8/6 if no labor prior Pt agreeable with plan  Arabella Merles, CNM 10/30/2019 9:38 PM

## 2019-10-30 NOTE — Discharge Instructions (Signed)
Fetal Movement Counts Patient Name: ________________________________________________ Patient Due Date: ____________________ What is a fetal movement count?  A fetal movement count is the number of times that you feel your baby move during a certain amount of time. This may also be called a fetal kick count. A fetal movement count is recommended for every pregnant woman. You may be asked to start counting fetal movements as early as week 28 of your pregnancy. Pay attention to when your baby is most active. You may notice your baby's sleep and wake cycles. You may also notice things that make your baby move more. You should do a fetal movement count:  When your baby is normally most active.  At the same time each day. A good time to count movements is while you are resting, after having something to eat and drink. How do I count fetal movements? 1. Find a quiet, comfortable area. Sit, or lie down on your side. 2. Write down the date, the start time and stop time, and the number of movements that you felt between those two times. Take this information with you to your health care visits. 3. Write down your start time when you feel the first movement. 4. Count kicks, flutters, swishes, rolls, and jabs. You should feel at least 10 movements. 5. You may stop counting after you have felt 10 movements, or if you have been counting for 2 hours. Write down the stop time. 6. If you do not feel 10 movements in 2 hours, contact your health care provider for further instructions. Your health care provider may want to do additional tests to assess your baby's well-being. Contact a health care provider if:  You feel fewer than 10 movements in 2 hours.  Your baby is not moving like he or she usually does. Date: ____________ Start time: ____________ Stop time: ____________ Movements: ____________ Date: ____________ Start time: ____________ Stop time: ____________ Movements: ____________ Date: ____________  Start time: ____________ Stop time: ____________ Movements: ____________ Date: ____________ Start time: ____________ Stop time: ____________ Movements: ____________ Date: ____________ Start time: ____________ Stop time: ____________ Movements: ____________ Date: ____________ Start time: ____________ Stop time: ____________ Movements: ____________ Date: ____________ Start time: ____________ Stop time: ____________ Movements: ____________ Date: ____________ Start time: ____________ Stop time: ____________ Movements: ____________ Date: ____________ Start time: ____________ Stop time: ____________ Movements: ____________ This information is not intended to replace advice given to you by your health care provider. Make sure you discuss any questions you have with your health care provider. Document Revised: 11/06/2018 Document Reviewed: 11/06/2018 Elsevier Patient Education  2020 [employer] Braxton Hicks Contractions Contractions of the uterus can occur throughout pregnancy, but they are not always a sign that you are in labor. You may have practice contractions called Braxton Hicks contractions. These false labor contractions are sometimes confused with true labor. What are Braxton Hicks contractions? Braxton Hicks contractions are tightening movements that occur in the muscles of the uterus before labor. Unlike true labor contractions, these contractions do not result in opening (dilation) and thinning of the cervix. Toward the end of pregnancy (32-34 weeks), Braxton Hicks contractions can happen more often and may become stronger. These contractions are sometimes difficult to tell apart from true labor because they can be very uncomfortable. You should not feel embarrassed if you go to the hospital with false labor. Sometimes, the only way to tell if you are in true labor is for your health care provider to look for changes in the cervix. The health care provider   will do a physical exam and may  monitor your contractions. If you are not in true labor, the exam should show that your cervix is not dilating and your water has not broken. If there are no other health problems associated with your pregnancy, it is completely safe for you to be sent home with false labor. You may continue to have Braxton Hicks contractions until you go into true labor. How to tell the difference between true labor and false labor True labor  Contractions last 30-70 seconds.  Contractions become very regular.  Discomfort is usually felt in the top of the uterus, and it spreads to the lower abdomen and low back.  Contractions do not go away with walking.  Contractions usually become more intense and increase in frequency.  The cervix dilates and gets thinner. False labor  Contractions are usually shorter and not as strong as true labor contractions.  Contractions are usually irregular.  Contractions are often felt in the front of the lower abdomen and in the groin.  Contractions may go away when you walk around or change positions while lying down.  Contractions get weaker and are shorter-lasting as time goes on.  The cervix usually does not dilate or become thin. Follow these instructions at home:   Take over-the-counter and prescription medicines only as told by your health care provider.  Keep up with your usual exercises and follow other instructions from your health care provider.  Eat and drink lightly if you think you are going into labor.  If Braxton Hicks contractions are making you uncomfortable: ? Change your position from lying down or resting to walking, or change from walking to resting. ? Sit and rest in a tub of warm water. ? Drink enough fluid to keep your urine pale yellow. Dehydration may cause these contractions. ? Do slow and deep breathing several times an hour.  Keep all follow-up prenatal visits as told by your health care provider. This is important. Contact a  health care provider if:  You have a fever.  You have continuous pain in your abdomen. Get help right away if:  Your contractions become stronger, more regular, and closer together.  You have fluid leaking or gushing from your vagina.  You pass blood-tinged mucus (bloody show).  You have bleeding from your vagina.  You have low back pain that you never had before.  You feel your baby's head pushing down and causing pelvic pressure.  Your baby is not moving inside you as much as it used to. Summary  Contractions that occur before labor are called Braxton Hicks contractions, false labor, or practice contractions.  Braxton Hicks contractions are usually shorter, weaker, farther apart, and less regular than true labor contractions. True labor contractions usually become progressively stronger and regular, and they become more frequent.  Manage discomfort from Braxton Hicks contractions by changing position, resting in a warm bath, drinking plenty of water, or practicing deep breathing. This information is not intended to replace advice given to you by your health care provider. Make sure you discuss any questions you have with your health care provider. Document Revised: 03/01/2017 Document Reviewed: 08/02/2016 Elsevier Patient Education  2020 Elsevier Inc.  

## 2019-10-30 NOTE — MAU Note (Signed)
2022 Pt provided with a clicker to click when she feels FM. Pt given instructions and

## 2019-10-30 NOTE — MAU Note (Signed)
Pt reports to MAU c/o DFM that has been ongoing since this morning. Pt denies pain. No bleeding or LOF. FHT 140.

## 2019-11-01 ENCOUNTER — Inpatient Hospital Stay (HOSPITAL_COMMUNITY): Payer: Medicaid Other | Admitting: Anesthesiology

## 2019-11-01 ENCOUNTER — Encounter (HOSPITAL_COMMUNITY): Payer: Self-pay | Admitting: Obstetrics and Gynecology

## 2019-11-01 ENCOUNTER — Other Ambulatory Visit: Payer: Self-pay

## 2019-11-01 ENCOUNTER — Inpatient Hospital Stay (HOSPITAL_COMMUNITY)
Admission: AD | Admit: 2019-11-01 | Discharge: 2019-11-02 | DRG: 805 | Disposition: A | Discharge status: Home / Self Care | Payer: Medicaid Other | Attending: Obstetrics & Gynecology | Admitting: Obstetrics & Gynecology

## 2019-11-01 DIAGNOSIS — O480 Post-term pregnancy: Principal | ICD-10-CM | POA: Diagnosis present

## 2019-11-01 DIAGNOSIS — O99824 Streptococcus B carrier state complicating childbirth: Secondary | ICD-10-CM | POA: Diagnosis present

## 2019-11-01 DIAGNOSIS — U071 COVID-19: Secondary | ICD-10-CM | POA: Diagnosis not present

## 2019-11-01 DIAGNOSIS — Z3A40 40 weeks gestation of pregnancy: Secondary | ICD-10-CM

## 2019-11-01 DIAGNOSIS — O09899 Supervision of other high risk pregnancies, unspecified trimester: Secondary | ICD-10-CM

## 2019-11-01 DIAGNOSIS — O26893 Other specified pregnancy related conditions, third trimester: Secondary | ICD-10-CM | POA: Diagnosis present

## 2019-11-01 DIAGNOSIS — O0990 Supervision of high risk pregnancy, unspecified, unspecified trimester: Secondary | ICD-10-CM

## 2019-11-01 DIAGNOSIS — Z603 Acculturation difficulty: Secondary | ICD-10-CM | POA: Diagnosis present

## 2019-11-01 DIAGNOSIS — Z789 Other specified health status: Secondary | ICD-10-CM | POA: Diagnosis present

## 2019-11-01 DIAGNOSIS — O9852 Other viral diseases complicating childbirth: Secondary | ICD-10-CM | POA: Diagnosis present

## 2019-11-01 DIAGNOSIS — O4202 Full-term premature rupture of membranes, onset of labor within 24 hours of rupture: Secondary | ICD-10-CM

## 2019-11-01 DIAGNOSIS — O99820 Streptococcus B carrier state complicating pregnancy: Secondary | ICD-10-CM

## 2019-11-01 LAB — CBC
HCT: 38.5 % (ref 36.0–46.0)
Hemoglobin: 13.6 g/dL (ref 12.0–15.0)
MCH: 31 pg (ref 26.0–34.0)
MCHC: 35.3 g/dL (ref 30.0–36.0)
MCV: 87.7 fL (ref 80.0–100.0)
Platelets: 247 10*3/uL (ref 150–400)
RBC: 4.39 MIL/uL (ref 3.87–5.11)
RDW: 12.4 % (ref 11.5–15.5)
WBC: 6.8 10*3/uL (ref 4.0–10.5)
nRBC: 0 % (ref 0.0–0.2)

## 2019-11-01 LAB — TYPE AND SCREEN
ABO/RH(D): O POS
Antibody Screen: NEGATIVE

## 2019-11-01 LAB — SARS CORONAVIRUS 2 BY RT PCR (HOSPITAL ORDER, PERFORMED IN ~~LOC~~ HOSPITAL LAB): SARS Coronavirus 2: POSITIVE — AB

## 2019-11-01 LAB — RPR: RPR Ser Ql: NONREACTIVE

## 2019-11-01 MED ORDER — PRENATAL MULTIVITAMIN CH
1.0000 | ORAL_TABLET | Freq: Every day | ORAL | Status: DC
  Administered 2019-11-02: 1 via ORAL
  Filled 2019-11-01: qty 1

## 2019-11-01 MED ORDER — ONDANSETRON HCL 4 MG PO TABS: 4.0000 mg | ORAL_TABLET | ORAL | Status: DC | PRN

## 2019-11-01 MED ORDER — TETANUS-DIPHTH-ACELL PERTUSSIS 5-2.5-18.5 LF-MCG/0.5 IM SUSP: 0.5000 mL | Freq: Once | INTRAMUSCULAR | Status: DC

## 2019-11-01 MED ORDER — TERBUTALINE SULFATE 1 MG/ML IJ SOLN: 0.2500 mg | Freq: Once | INTRAMUSCULAR | Status: DC | PRN

## 2019-11-01 MED ORDER — DOCUSATE SODIUM 100 MG PO CAPS
100.0000 mg | ORAL_CAPSULE | Freq: Two times a day (BID) | ORAL | Status: DC
  Administered 2019-11-01 – 2019-11-02 (×2): 100 mg via ORAL
  Filled 2019-11-01 (×2): qty 1

## 2019-11-01 MED ORDER — PENICILLIN G POT IN DEXTROSE 60000 UNIT/ML IV SOLN
3.0000 10*6.[IU] | INTRAVENOUS | Status: DC
  Administered 2019-11-01 (×2): 3 10*6.[IU] via INTRAVENOUS
  Filled 2019-11-01 (×2): qty 50

## 2019-11-01 MED ORDER — DIPHENHYDRAMINE HCL 50 MG/ML IJ SOLN: 12.5000 mg | INTRAMUSCULAR | Status: DC | PRN

## 2019-11-01 MED ORDER — LIDOCAINE HCL (PF) 1 % IJ SOLN: 30.0000 mL | INTRAMUSCULAR | Status: DC | PRN

## 2019-11-01 MED ORDER — OXYTOCIN-SODIUM CHLORIDE 30-0.9 UT/500ML-% IV SOLN: 2.5000 [IU]/h | INTRAVENOUS | Status: DC

## 2019-11-01 MED ORDER — OXYCODONE-ACETAMINOPHEN 5-325 MG PO TABS: 2.0000 | ORAL_TABLET | ORAL | Status: DC | PRN

## 2019-11-01 MED ORDER — OXYTOCIN BOLUS FROM INFUSION: 333.0000 mL | Freq: Once | INTRAVENOUS | Status: DC

## 2019-11-01 MED ORDER — PHENYLEPHRINE 40 MCG/ML (10ML) SYRINGE FOR IV PUSH (FOR BLOOD PRESSURE SUPPORT): 80.0000 ug | PREFILLED_SYRINGE | INTRAVENOUS | Status: DC | PRN

## 2019-11-01 MED ORDER — OXYCODONE-ACETAMINOPHEN 5-325 MG PO TABS: 1.0000 | ORAL_TABLET | ORAL | Status: DC | PRN

## 2019-11-01 MED ORDER — OXYTOCIN-SODIUM CHLORIDE 30-0.9 UT/500ML-% IV SOLN
1.0000 m[IU]/min | INTRAVENOUS | Status: DC
  Administered 2019-11-01: 2 m[IU]/min via INTRAVENOUS
  Filled 2019-11-01: qty 500

## 2019-11-01 MED ORDER — DIBUCAINE (PERIANAL) 1 % EX OINT: 1.0000 "application " | TOPICAL_OINTMENT | CUTANEOUS | Status: DC | PRN

## 2019-11-01 MED ORDER — SIMETHICONE 80 MG PO CHEW: 80.0000 mg | CHEWABLE_TABLET | ORAL | Status: DC | PRN

## 2019-11-01 MED ORDER — ONDANSETRON HCL 4 MG/2ML IJ SOLN: 4.0000 mg | INTRAMUSCULAR | Status: DC | PRN

## 2019-11-01 MED ORDER — DIPHENHYDRAMINE HCL 25 MG PO CAPS: 25.0000 mg | ORAL_CAPSULE | Freq: Four times a day (QID) | ORAL | Status: DC | PRN

## 2019-11-01 MED ORDER — WITCH HAZEL-GLYCERIN EX PADS: 1.0000 "application " | MEDICATED_PAD | CUTANEOUS | Status: DC | PRN

## 2019-11-01 MED ORDER — EPHEDRINE 5 MG/ML INJ: 10.0000 mg | INTRAVENOUS | Status: DC | PRN

## 2019-11-01 MED ORDER — LACTATED RINGERS IV SOLN: INTRAVENOUS | Status: DC

## 2019-11-01 MED ORDER — ACETAMINOPHEN 325 MG PO TABS: 650.0000 mg | ORAL_TABLET | ORAL | Status: DC | PRN

## 2019-11-01 MED ORDER — LACTATED RINGERS IV SOLN: 500.0000 mL | Freq: Once | INTRAVENOUS | Status: DC

## 2019-11-01 MED ORDER — BENZOCAINE-MENTHOL 20-0.5 % EX AERO: 1.0000 "application " | INHALATION_SPRAY | CUTANEOUS | Status: DC | PRN

## 2019-11-01 MED ORDER — ONDANSETRON HCL 4 MG/2ML IJ SOLN: 4.0000 mg | Freq: Four times a day (QID) | INTRAMUSCULAR | Status: DC | PRN

## 2019-11-01 MED ORDER — LIDOCAINE HCL (PF) 1 % IJ SOLN
INTRAMUSCULAR | Status: DC | PRN
  Administered 2019-11-01: 2 mL via EPIDURAL
  Administered 2019-11-01: 10 mL via EPIDURAL

## 2019-11-01 MED ORDER — ACETAMINOPHEN 325 MG PO TABS
650.0000 mg | ORAL_TABLET | Freq: Four times a day (QID) | ORAL | Status: DC
  Administered 2019-11-01 – 2019-11-02 (×4): 650 mg via ORAL
  Filled 2019-11-01 (×4): qty 2

## 2019-11-01 MED ORDER — LACTATED RINGERS IV SOLN
500.0000 mL | INTRAVENOUS | Status: DC | PRN
  Administered 2019-11-01: 700 mL via INTRAVENOUS

## 2019-11-01 MED ORDER — FENTANYL-BUPIVACAINE-NACL 0.5-0.125-0.9 MG/250ML-% EP SOLN
12.0000 mL/h | EPIDURAL | Status: DC | PRN
  Filled 2019-11-01: qty 250

## 2019-11-01 MED ORDER — SODIUM CHLORIDE 0.9 % IV SOLN
5.0000 10*6.[IU] | Freq: Once | INTRAVENOUS | Status: AC
  Administered 2019-11-01: 5 10*6.[IU] via INTRAVENOUS
  Filled 2019-11-01: qty 5

## 2019-11-01 MED ORDER — IBUPROFEN 600 MG PO TABS
600.0000 mg | ORAL_TABLET | Freq: Four times a day (QID) | ORAL | Status: DC | PRN
  Administered 2019-11-02: 600 mg via ORAL
  Filled 2019-11-01: qty 1

## 2019-11-01 MED ORDER — COCONUT OIL OIL: 1.0000 "application " | TOPICAL_OIL | Status: DC | PRN

## 2019-11-01 MED ORDER — SOD CITRATE-CITRIC ACID 500-334 MG/5ML PO SOLN: 30.0000 mL | ORAL | Status: DC | PRN

## 2019-11-01 MED ORDER — SENNOSIDES-DOCUSATE SODIUM 8.6-50 MG PO TABS
2.0000 | ORAL_TABLET | ORAL | Status: DC
  Administered 2019-11-01: 2 via ORAL
  Filled 2019-11-01: qty 2

## 2019-11-01 MED ORDER — SODIUM CHLORIDE (PF) 0.9 % IJ SOLN
INTRAMUSCULAR | Status: DC | PRN
  Administered 2019-11-01: 12 mL/h via EPIDURAL

## 2019-11-01 NOTE — Anesthesia Preprocedure Evaluation (Signed)
Anesthesia Evaluation  Patient identified by MRN, date of birth, ID band Patient awake    Reviewed: Allergy & Precautions, Patient's Chart, lab work & pertinent test results  Airway Mallampati: II  TM Distance: >3 FB Neck ROM: Full    Dental no notable dental hx.    Pulmonary  COVID + on screening, asymptomatic    Pulmonary exam normal breath sounds clear to auscultation       Cardiovascular negative cardio ROS Normal cardiovascular exam Rhythm:Regular Rate:Normal     Neuro/Psych negative neurological ROS  negative psych ROS   GI/Hepatic negative GI ROS, Neg liver ROS,   Endo/Other  negative endocrine ROS  Renal/GU negative Renal ROS  negative genitourinary   Musculoskeletal negative musculoskeletal ROS (+)   Abdominal   Peds negative pediatric ROS (+)  Hematology negative hematology ROS (+) hct 38.5, plt 247   Anesthesia Other Findings speaks kinyarwanda, needs translator  Reproductive/Obstetrics (+) Pregnancy                             Anesthesia Physical Anesthesia Plan  ASA: II and emergent  Anesthesia Plan: Epidural   Post-op Pain Management:    Induction:   PONV Risk Score and Plan:   Airway Management Planned: Natural Airway  Additional Equipment: None  Intra-op Plan:   Post-operative Plan:   Informed Consent: I have reviewed the patients History and Physical, chart, labs and discussed the procedure including the risks, benefits and alternatives for the proposed anesthesia with the patient or authorized representative who has indicated his/her understanding and acceptance.     Interpreter used for [employer] Discussed with:   Anesthesia Plan Comments:         Anesthesia Quick Evaluation

## 2019-11-01 NOTE — Progress Notes (Signed)
Labor Progress Note Kim Blanchard is a 24 y.o. G2P0101 at [redacted]w[redacted]d presented for SOL.  S: She was feeling increased pelvic pressure and has a history of precipitous delivery.  Currently having frequent contractions.  Pain well tolerated with epidural.  O:  BP 123/76   Pulse 75   Temp 98.2 F (36.8 C) (Axillary)   Resp 18   Ht 5\' 2"  (1.575 m)   Wt 74.4 kg   LMP 01/23/2019   SpO2 100%   BMI 30.00 kg/m  EFM: 130/moderate var/pos accels, one variable, good return to baseline  CVE: Dilation: 8 Effacement (%): 80 Cervical Position: Middle Station: 0 Presentation: Vertex Exam by:: Dr. [email or web address]   A&P: 24 y.o. 22 [redacted]w[redacted]d here for SOL.  #Labor: Progressing well. Currently on pit, s/p AROM. Continue with pit. For now.  #Pain: epidural #FWB: Cat II, overall moderate variability and good return to baseline. #GBS positive  #COVID pos: no SOB. Continue to monitor sxs.  [redacted]w[redacted]d, MD 11:50 AM

## 2019-11-01 NOTE — Lactation Note (Addendum)
This note was copied from a baby's chart. Lactation Consultation Note  Patient Name: Kim Blanchard Date: 11/01/2019  Baby Kim Rodenbaugh now 5 hours old.  Parents report she is sleepy.  Parents report they do not want an interpreter. Mom reports she is an experienced breastfeeding mom who breastfed first daughter now almost [age] until [age] years.  Assisted in spoon feeding small drops of colostrum to infant and unswaddling her. Infant woke up and started cuing.   Assisted mom with latching infant to left breast.  Mom continuosly does scissor hold and pulls nipple out of her mouth. Assisted with relatching her.  Left mom breastfeeding.  Urged her to call lactation as needed.    Maternal Data    Feeding    LATCH Score                   Interventions    Lactation Tools Discussed/Used     Consult Status      Kim Blanchard 11/01/2019, 7:28 PM

## 2019-11-01 NOTE — H&P (Signed)
LABOR AND DELIVERY ADMISSION HISTORY AND PHYSICAL NOTE  Kim Blanchard is a 24 y.o. female G51P0101 with IUP at [redacted]w[redacted]d by LMP c/w 19wk [place] presenting for SOL.   Reports contractions She reports positive fetal movement. She denies leakage of fluid, vaginal bleeding.   She plans on breast feeding. Her contraception plan is: unsure.  Prenatal History/Complications: PNC at [facility]:  @[redacted]w[redacted]d , CWD, normal anatomy, variable presentation, posterior placenta, 74%ile, EFW 295g  Pregnancy complications:  - Hx of preterm delivery at 34 weeks - GBS+ - Language barrier )  Past Medical History: Past Medical History:  Diagnosis Date  . Preterm labor     Past Surgical History: History reviewed. No pertinent surgical history.  Obstetrical History: OB History    Gravida  2   Para  1   Term      Preterm  1   AB      Living  1     SAB      TAB      Ectopic      Multiple  0   Live Births  1           Social History: Social History   Socioeconomic History  . Marital status: Single    Spouse name: Not on file  . Number of children: Not on file  . Years of education: Not on file  . Highest education level: Not on file  Occupational History  . Not on file  Tobacco Use  . Smoking status: Never Smoker  . Smokeless tobacco: Never Used  Vaping Use  . Vaping Use: Never used  Substance and Sexual Activity  . Alcohol use: Never  . Drug use: Never  . Sexual activity: Not Currently    Birth control/protection: None  Other Topics Concern  . Not on file  Social History Narrative  . Not on file   Social Determinants of Health   Financial Resource Strain:   . Difficulty of Paying Living Expenses:   Food Insecurity: No Food Insecurity  . Worried About Engineer, agricultural in the Last Year: Never true  . Ran Out of Food in the Last Year: Never true  Transportation Needs: No Transportation Needs  . Lack of Transportation (Medical): No  . Lack of  Transportation (Non-Medical): No  Physical Activity:   . Days of Exercise per Week:   . Minutes of Exercise per Session:   Stress:   . Feeling of Stress :   Social Connections:   . Frequency of Communication with Friends and Family:   . Frequency of Social Gatherings with Friends and Family:   . Attends Religious Services:   . Active Member of Clubs or Organizations:   . Attends Programme researcher, broadcasting/film/video Meetings:   Banker Marital Status:     Family History: History reviewed. No pertinent family history.  Allergies: No Known Allergies  Medications Prior to Admission  Medication Sig Dispense Refill Last Dose  . cyclobenzaprine (FLEXERIL) 10 MG tablet Take 1 tablet (10 mg total) by mouth at bedtime. 20 tablet 0   . Prenatal Vit-DSS-Fe Fum-FA (PRENATAL 19) 29-1 MG TABS Take 1 tablet by mouth daily. (Patient not taking: Reported on 10/29/2019) 30 tablet 11      Review of Systems  All systems reviewed and negative except as stated in HPI  Physical Exam Blood pressure 124/85, pulse 101, temperature 97.8 F (36.6 C), temperature source Oral, resp. rate 20, last menstrual period 01/23/2019, SpO2 100 %, unknown if currently  breastfeeding. General appearance: alert, oriented, NAD Lungs: normal respiratory effort Heart: regular rate Abdomen: soft, non-tender; gravid, leopolds <3000g Extremities: No calf swelling or tenderness Presentation: cephalic by RN SVE  Fetal monitoringBaseline: 165 bpm, Variability: Good {> 6 bpm), Accelerations: Reactive and Decelerations: Late Uterine activity: regular q3-5 min  Dilation: 5.5 Effacement (%): 50 Station: Ballotable Exam by:: Emly,CNM  Prenatal labs: ABO, Rh: O/Positive/-- (12/23 1055) Antibody: Negative (12/23 1055) Rubella: 4.47 (12/23 1055) RPR: Non Reactive (05/19 0850)  HBsAg: Negative (12/23 1055)  HIV: Non Reactive (05/19 0850)  GC/Chlamydia: neg/neg  GBS: Positive/-- (07/02 1039)  2-hr GTT: normal (37,858,850) Genetic screening:   Low risk panorama Anatomy US: normal  Prenatal Transfer Tool  Maternal Diabetes: No Genetic Screening: Normal Maternal Ultrasounds/Referrals: Normal Fetal Ultrasounds or other Referrals:  None Maternal Substance Abuse:  No Significant Maternal Medications:  None Significant Maternal Lab Results: Group B Strep positive  No results found for this or any previous visit (from the past 24 hour(s)).  Patient Active Problem List   Diagnosis Date Noted  . Group B Streptococcus carrier, +RV culture, currently pregnant 10/05/2019  . UTI in pregnancy 05/05/2019  . Supervision of high risk pregnancy, antepartum 03/24/2019  . History of preterm delivery, currently pregnant 03/24/2019  . Language barrier affecting health care 09/02/2017    Assessment: Kim Blanchard is a 24 y.o. G2P0101 at [redacted]w[redacted]d here for spontaneous labor.  #Labor: here with spontaneous labor, given GBS+ will manage expectantly until adequately treated, consider AROM/pit PRN at that point.  #Pain: IV pain meds PRN, epidural upon request #FWB: Cat II. Elevated baseline and some lates with contractions though overall reassuring for moderate variability and accels. Plan to bolus and monitor closely.  #GBS/ID: Positive, penicillin ordered #COVID: swab pending #MOF: Breast #MOC: Unsure #Circ: n/a  Venora Maples 11/01/2019, 2:26 AM

## 2019-11-01 NOTE — Anesthesia Procedure Notes (Signed)
Epidural Patient location during procedure: OB Start time: 11/01/2019 4:54 AM End time: 11/01/2019 5:08 AM  Staffing Anesthesiologist: Lannie Fields, DO Performed: anesthesiologist   Preanesthetic Checklist Completed: patient identified, IV checked, risks and benefits discussed, monitors and equipment checked, pre-op evaluation and timeout performed  Epidural Patient position: sitting Prep: DuraPrep and site prepped and draped Patient monitoring: continuous pulse ox, blood pressure, heart rate and cardiac monitor Approach: midline Location: L3-L4 Injection technique: LOR air  Needle:  Needle type: Tuohy  Needle gauge: 17 G Needle length: 9 cm Needle insertion depth: 5 cm Catheter type: closed end flexible Catheter size: 19 Gauge Catheter at skin depth: 10 cm Test dose: negative  Assessment Sensory level: T8 Events: blood not aspirated, injection not painful, no injection resistance, no paresthesia and negative IV test  Additional Notes Patient identified. Risks/Benefits/Options discussed with patient including but not limited to bleeding, infection, nerve damage, paralysis, failed block, incomplete pain control, headache, blood pressure changes, nausea, vomiting, reactions to medication both or allergic, itching and postpartum back pain. Confirmed with bedside nurse the patient's most recent platelet count. Confirmed with patient that they are not currently taking any anticoagulation, have any bleeding history or any family history of bleeding disorders. Patient expressed understanding and wished to proceed. All questions were answered. Sterile technique was used throughout the entire procedure. Please see nursing notes for vital signs. Test dose was given through epidural catheter and negative prior to continuing to dose epidural or start infusion. Warning signs of high block given to the patient including shortness of breath, tingling/numbness in hands, complete motor block,  or any concerning symptoms with instructions to call for help. Patient was given instructions on fall risk and not to get out of bed. All questions and concerns addressed with instructions to call with any issues or inadequate analgesia.  Reason for block:procedure for pain

## 2019-11-01 NOTE — Progress Notes (Signed)
Labor Progress Note Kim Blanchard is a 24 y.o. G2P0101 at [redacted]w[redacted]d presented for IOL   S: Doing well. No new concerns.  Pain well controlled with epi.  No SOB.  O:  BP (!) 141/72   Pulse 79   Temp (!) 97.4 F (36.3 C) (Axillary)   Resp 18   Ht 5\' 2"  (1.575 m)   Wt 74.4 kg   LMP 01/23/2019   SpO2 100%   BMI 30.00 kg/m  EFM: 120/moderate var/accels no decels  CVE: Dilation: 7 Effacement (%): 60 Station: -1 Presentation: Vertex Exam by:: Dr. [email or web address]   A&P: 25 y.o. 22 [redacted]w[redacted]d here for SOL.  #Labor: Progressing well. Confirmed presentation w/ [redacted]w[redacted]d followed my cervical exam.  Started Pit. AROM with contraction without issue. #Pain: epi in place #FWB: Cat I #GBS pos. On PCN. Adequate treatment. #COVID positive: Asympotomatic screening. No SOB or O2 requirement. #one elevated BP: now previous elevated BPs. Monitor for now. If additional elevated BPs will draw labs.   [place], MD 9:24 AM

## 2019-11-01 NOTE — Progress Notes (Signed)
LABOR PROGRESS NOTE  Marc Sivertsen is a 24 y.o. G2P0101 at [redacted]w[redacted]d  admitted for SOL.  Subjective: Strip review  Objective: BP 125/81   Pulse 82   Temp 98.6 F (37 C) (Oral)   Resp 16   Ht 5\' 2"  (1.575 m)   Wt 74.4 kg   LMP 01/23/2019   SpO2 100%   BMI 30.00 kg/m  or  Vitals:   11/01/19 0531 11/01/19 0535 11/01/19 0536 11/01/19 0601  BP: 123/76  124/73 125/81  Pulse: 79  85 82  Resp:    16  Temp:      TempSrc:      SpO2:  100%    Weight:      Height:         Dilation: 5.5 Effacement (%): 50 Station: Ballotable Presentation: Vertex Exam by:: [email or web address] RN FHT: baseline rate 130, moderate varibility, +acel, -decel Toco: irregular q8 min  Labs: Lab Results  Component Value Date   WBC 6.8 11/01/2019   HGB 13.6 11/01/2019   HCT 38.5 11/01/2019   MCV 87.7 11/01/2019   PLT 247 11/01/2019    Patient Active Problem List   Diagnosis Date Noted  . Post-dates pregnancy 11/01/2019  . Group B Streptococcus carrier, +RV culture, currently pregnant 10/05/2019  . UTI in pregnancy 05/05/2019  . Supervision of high risk pregnancy, antepartum 03/24/2019  . History of preterm delivery, currently pregnant 03/24/2019  . Language barrier affecting health care 09/02/2017    Assessment / Plan: 24 y.o. G2P0101 at [redacted]w[redacted]d here for early labor.  Labor: managed expectantly on admission given GBS positive. Contractions spaced, once adequately treated for GBS plan to start pitocin around 0730 if contractions still spaced.  Fetal Wellbeing:  Cat I Pain Control:  epidural GBS: Positive, on penicillin Anticipated MOD:  SVD  COVID+: asymptomatic on screening swab  [redacted]w[redacted]d, MD/[facility] OB Fellow  11/01/2019, 6:33 AM

## 2019-11-01 NOTE — Discharge Summary (Signed)
Postpartum Discharge Summary      Patient Name: Kim Blanchard DOB: 04/20/1995 MRN: [id number]  Date of admission: 11/01/2019 Delivery date:11/01/2019  Delivering provider: Matilde Haymaker  Date of discharge: 11/02/2019  Admitting diagnosis: Post-dates pregnancy [O48.0] Intrauterine pregnancy: [redacted]w[redacted]d    Secondary diagnosis:  Active Problems:   Language barrier affecting health care   Supervision of high risk pregnancy, antepartum   History of preterm delivery, currently pregnant   Group B Streptococcus carrier, +RV culture, currently pregnant   Post-dates pregnancy  Additional problems: as noted above    Discharge diagnosis: Term Pregnancy Delivered                                              Post partum procedures:none Augmentation: AROM and Pitocin Complications: None  Hospital course: Onset of Labor With Vaginal Delivery      24y.o. yo [id number] admitted in Latent Labor on 11/01/2019. Patient had an uncomplicated labor course as follows:  Membrane Rupture Time/Date: 9:06 AM ,11/01/2019   Delivery Method:Vaginal, Spontaneous  Episiotomy: None  Lacerations:  1st degree;Periurethral  Patient had an uncomplicated postpartum course.  She is ambulating, tolerating a regular diet, passing flatus, and urinating well. Patient is discharged home in stable condition on 11/02/19.  Newborn Data: Birth date:11/01/2019  Birth time:1:41 PM  Gender:Female  Living status:Living  Apgars:7 ,9  Weight:3286 g   Magnesium Sulfate received: No BMZ received: No Rhophylac:N/A MMR:N/A T-DaP:Given prenatally Flu: Yes Transfusion:No  Physical exam  Vitals:   11/01/19 1835 11/01/19 2230 11/02/19 0235 11/02/19 0637  BP: (!) 100/62 115/65 111/65 114/68  Pulse: 65 73 71 91  Resp: '18 18 18 18  '$ Temp: 99.9 F (37.7 C) 98.4 F (36.9 C) 97.8 F (36.6 C) 98 F (36.7 C)  TempSrc: Axillary Oral Oral Oral  SpO2: 100% 99% 100% 99%  Weight:      Height:       General: alert, cooperative  and no distress Lochia: appropriate Uterine Fundus: firm Incision: N/A DVT Evaluation: No evidence of DVT seen on physical exam. Labs: Lab Results  Component Value Date   WBC 6.8 11/01/2019   HGB 13.6 11/01/2019   HCT 38.5 11/01/2019   MCV 87.7 11/01/2019   PLT 247 11/01/2019   CMP Latest Ref Rng & Units 08/08/2017  Glucose 65 - 99 mg/dL 95  BUN 6 - 20 mg/dL <5(L)  Creatinine 0.44 - 1.00 mg/dL 0.67  Sodium 135 - 145 mmol/L 136  Potassium 3.5 - 5.1 mmol/L 4.1  Chloride 101 - 111 mmol/L 107  CO2 22 - 32 mmol/L 22  Calcium 8.9 - 10.3 mg/dL 9.3  Total Protein 6.5 - 8.1 g/dL 6.6  Total Bilirubin 0.3 - 1.2 mg/dL 0.8  Alkaline Phos 38 - 126 U/L 49  AST 15 - 41 U/L 18  ALT 14 - 54 U/L 20   Edinburgh Score: Edinburgh Postnatal Depression Scale Screening Tool 03/03/2018  I have been able to laugh and see the funny side of things. 1  I have looked forward with enjoyment to things. 0  I have blamed myself unnecessarily when things went wrong. 0  I have been anxious or worried for no good reason. 0  I have felt scared or panicky for no good reason. 0  Things have been getting on top of me. 0  I have  been so unhappy that I have had difficulty sleeping. 0  I have felt sad or miserable. 0  I have been so unhappy that I have been crying. 0  The thought of harming myself has occurred to me. 0  Edinburgh Postnatal Depression Scale Total 1     After visit meds:  Allergies as of 11/02/2019   No Known Allergies     Medication List    TAKE these medications   acetaminophen 325 MG tablet Commonly known as: Tylenol Take 2 tablets (650 mg total) by mouth every 6 (six) hours.   cyclobenzaprine 10 MG tablet Commonly known as: FLEXERIL Take 1 tablet (10 mg total) by mouth at bedtime.   ibuprofen 600 MG tablet Commonly known as: ADVIL Take 1 tablet (600 mg total) by mouth every 6 (six) hours as needed for moderate pain.   Prenatal 19 29-1 MG Tabs Take 1 tablet by mouth daily.         Discharge home in stable condition Infant Feeding: Breast Infant Disposition:home with mother Discharge instruction: per After Visit Summary and Postpartum booklet. Activity: Advance as tolerated. Pelvic rest for 6 weeks.  Diet: routine diet Future Appointments: Future Appointments  Date Time Provider [place]  11/06/2019  8:45 AM [facility] [place] [facility] None  12/01/2019  1:55 PM Jorje Guild, NP [facility]   Follow up Visit:  Please schedule this patient for a In person postpartum visit in 4 weeks with the following provider: Any provider. Additional Postpartum F/U: None Low risk pregnancy complicated by: GBS+ with adequate antibiotics Delivery mode:  Vaginal, Spontaneous  Anticipated Birth Control:  Declines  Discussed COVID 19 precautions and quarantining for 2 weeks after a positive test. IF she develops symptoms, she will go to the main [facility].  11/02/2019 Liboria Putnam L Breylin Dom, DO

## 2019-11-02 LAB — CBC
HCT: 35.7 % — ABNORMAL LOW (ref 36.0–46.0)
Hemoglobin: 11.9 g/dL — ABNORMAL LOW (ref 12.0–15.0)
MCH: 30.1 pg (ref 26.0–34.0)
MCHC: 33.3 g/dL (ref 30.0–36.0)
MCV: 90.4 fL (ref 80.0–100.0)
Platelets: 162 10*3/uL (ref 150–400)
RBC: 3.95 MIL/uL (ref 3.87–5.11)
RDW: 12.6 % (ref 11.5–15.5)
WBC: 7.8 10*3/uL (ref 4.0–10.5)
nRBC: 0 % (ref 0.0–0.2)

## 2019-11-02 MED ORDER — ACETAMINOPHEN 325 MG PO TABS: 650.0000 mg | ORAL_TABLET | Freq: Four times a day (QID) | ORAL | 0 refills | Status: DC

## 2019-11-02 MED ORDER — IBUPROFEN 600 MG PO TABS: 600.0000 mg | ORAL_TABLET | Freq: Four times a day (QID) | ORAL | 0 refills | Status: DC | PRN

## 2019-11-02 NOTE — Anesthesia Postprocedure Evaluation (Signed)
Anesthesia Post Note  Patient: Kim Blanchard  Procedure(s) Performed: AN AD HOC LABOR EPIDURAL     Patient location during evaluation: Mother Baby Anesthesia Type: Epidural Level of consciousness: awake and alert Pain management: pain level controlled Vital Signs Assessment: post-procedure vital signs reviewed and stable Respiratory status: spontaneous breathing, nonlabored ventilation and respiratory function stable Cardiovascular status: stable Postop Assessment: no headache, no backache, epidural receding and able to ambulate Anesthetic complications: no Comments: Patient up and walking per RN, no other issues.    No complications documented.  Last Vitals:  Vitals:   11/02/19 0235 11/02/19 0637  BP: 111/65 114/68  Pulse: 71 91  Resp: 18 18  Temp: 36.6 C 36.7 C  SpO2: 100% 99%    Last Pain:  Vitals:   11/02/19 0637  TempSrc: Oral  PainSc: 0-No pain   Pain Goal: Patients Stated Pain Goal: 0 (11/01/19 0134)                 Lannie Fields

## 2019-11-02 NOTE — Discharge Instructions (Signed)

## 2019-11-04 ENCOUNTER — Encounter: Payer: Medicaid Other | Admitting: Obstetrics and Gynecology

## 2019-11-04 ENCOUNTER — Other Ambulatory Visit: Payer: Medicaid Other

## 2019-11-06 ENCOUNTER — Inpatient Hospital Stay (HOSPITAL_COMMUNITY): Admission: AD | Admit: 2019-11-06 | Payer: Medicaid Other | Source: Home / Self Care | Admitting: Family Medicine

## 2019-11-06 ENCOUNTER — Inpatient Hospital Stay (HOSPITAL_COMMUNITY): Payer: Medicaid Other

## 2019-12-01 ENCOUNTER — Ambulatory Visit: Payer: Medicaid Other | Admitting: Student

## 2019-12-01 ENCOUNTER — Encounter: Payer: Self-pay | Admitting: Student

## 2019-12-10 ENCOUNTER — Encounter: Payer: Self-pay | Admitting: General Practice

## 2020-05-07 IMAGING — US US OB COMP LESS 14 WK
1 series · 14 of 28 positions shown · non-contrast
Comparison: None.

CLINICAL DATA: 22-year-old female with positive HCG level
presenting with pelvic cramping. LMP: 05/19/2017 corresponding to an
estimated gestational age of 11 weeks, 4 days.

EXAM:
OBSTETRIC <14 WK ULTRASOUND
TECHNIQUE: Transabdominal ultrasound was performed for evaluation of the
gestation as well as the maternal uterus and adnexal regions.

[Series 1: us ob comp less 14 wk · 0.17mm/px · 42 acquisitions, 14 frames shown]
[im 2/42]
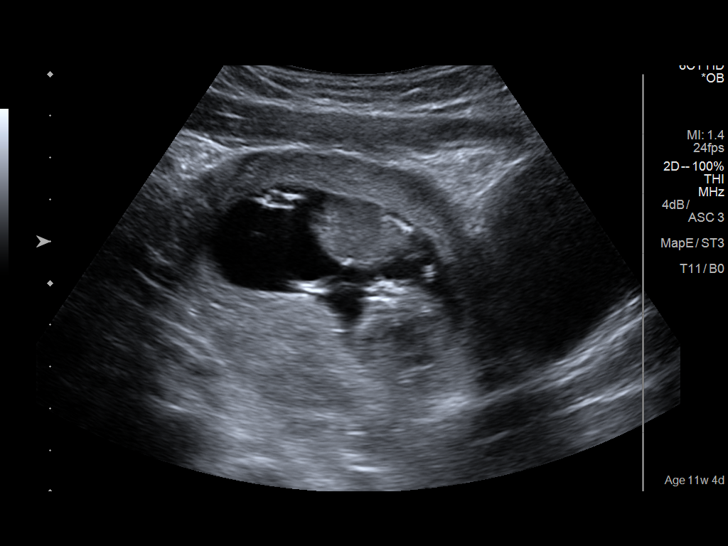
[im 5/42]
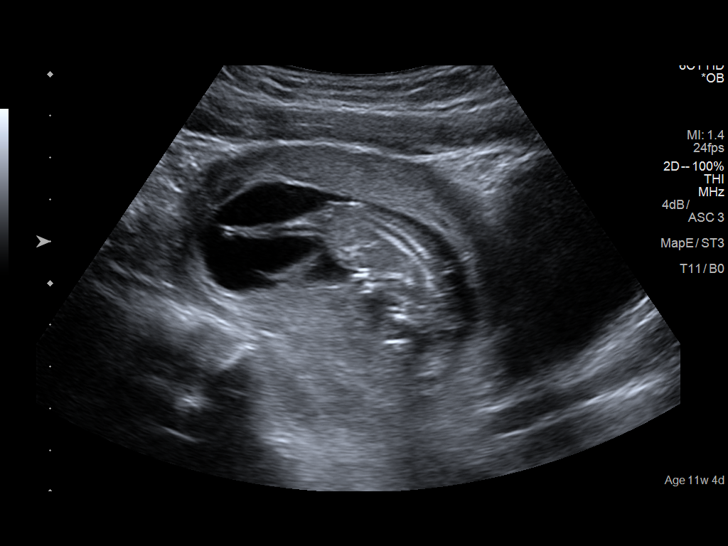
[im 8/42]
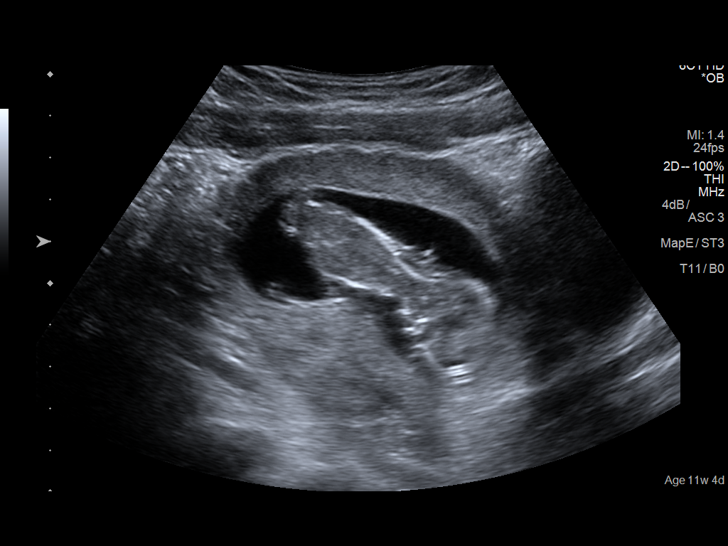
[im 11/42]
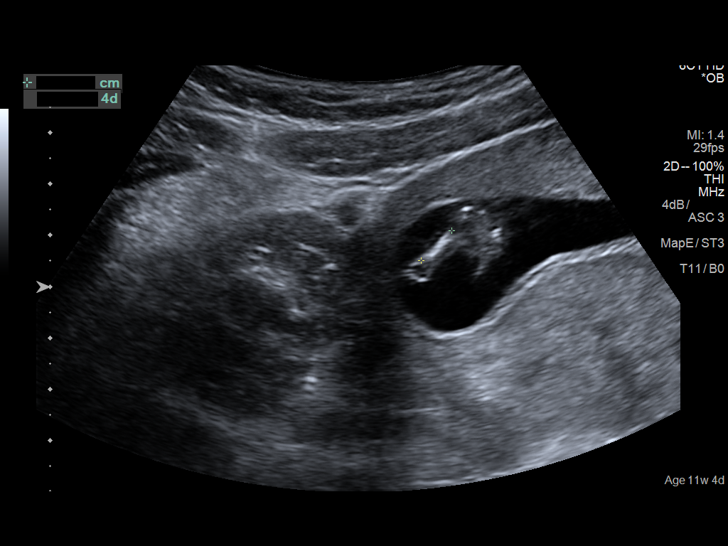
[im 14/42]
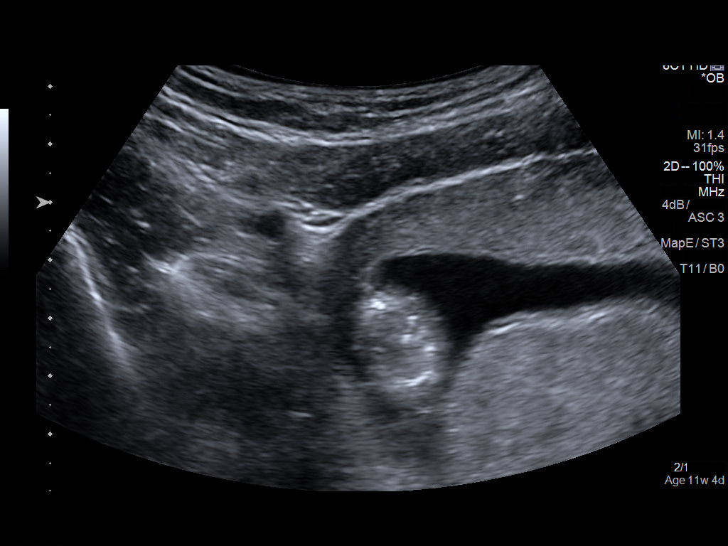
[im 17/42]
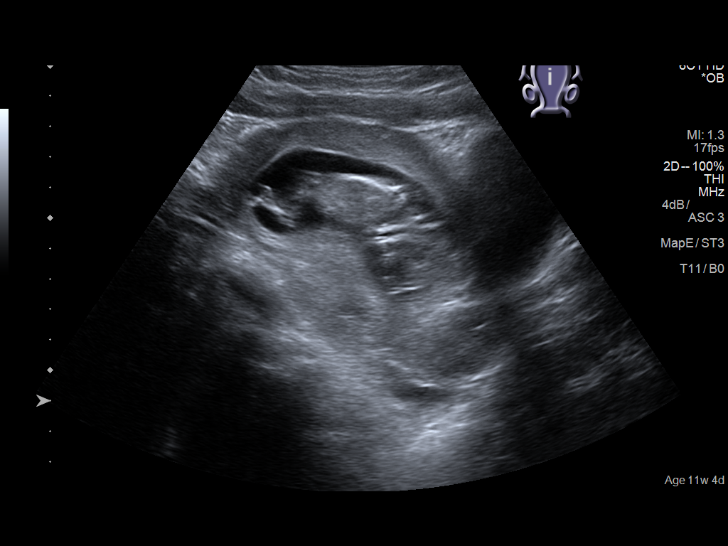
[im 20/42]
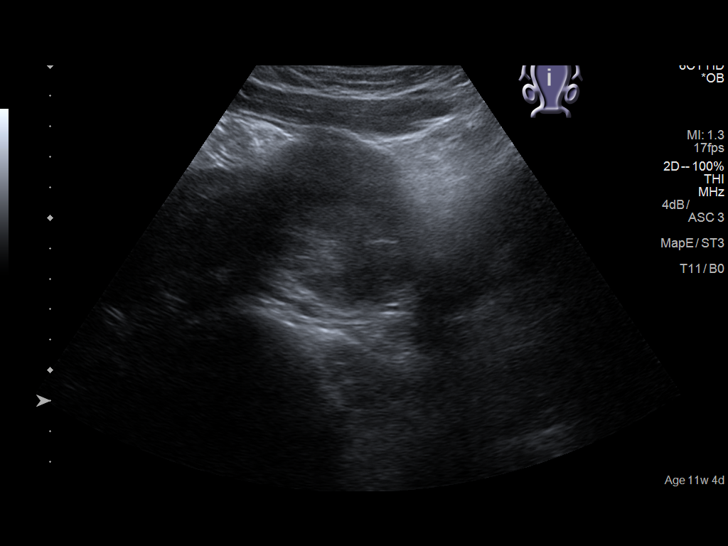
[im 23/42]
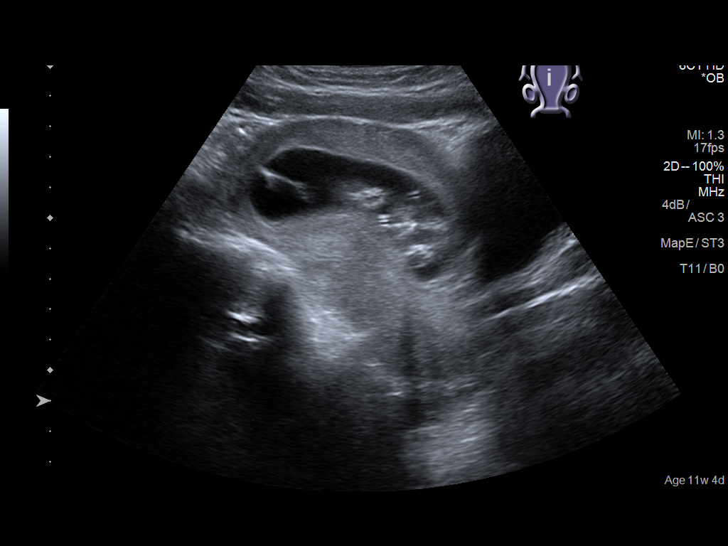
[im 26/42]
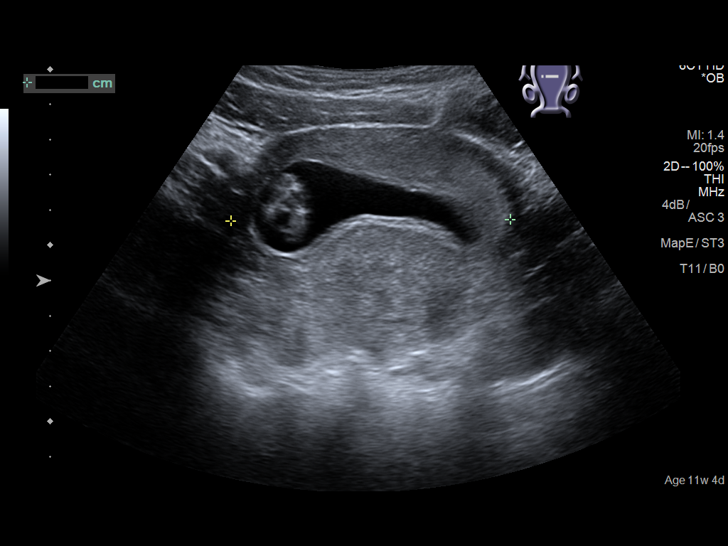
[im 29/42]
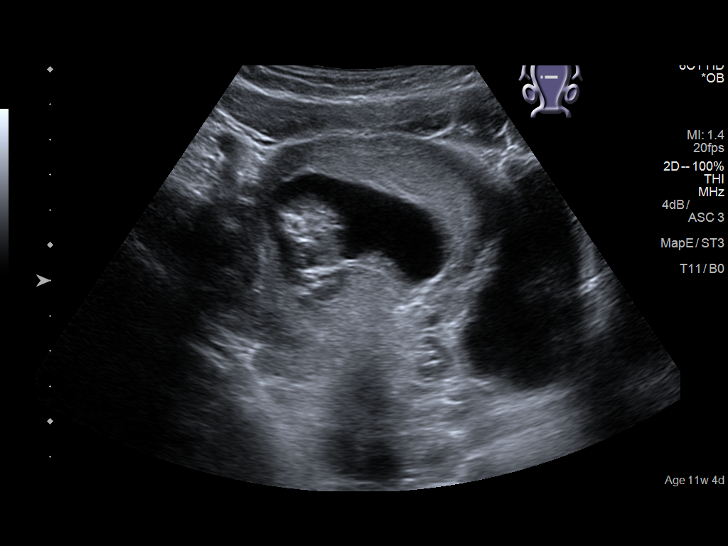
[im 32/42]
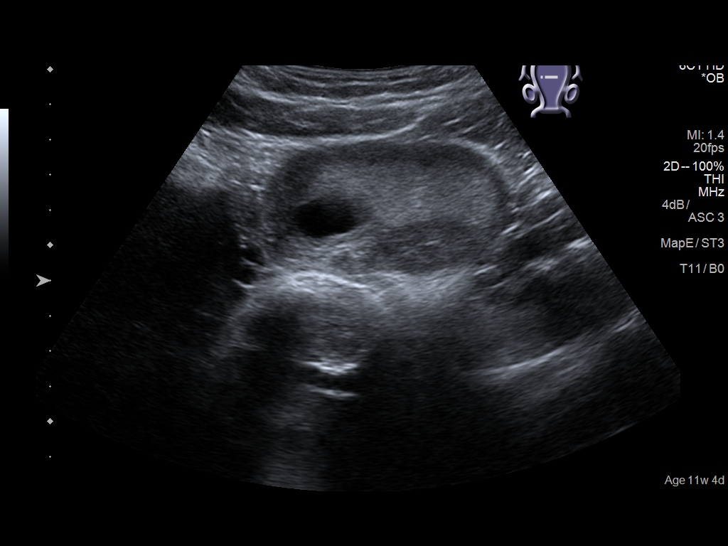
[im 35/42]
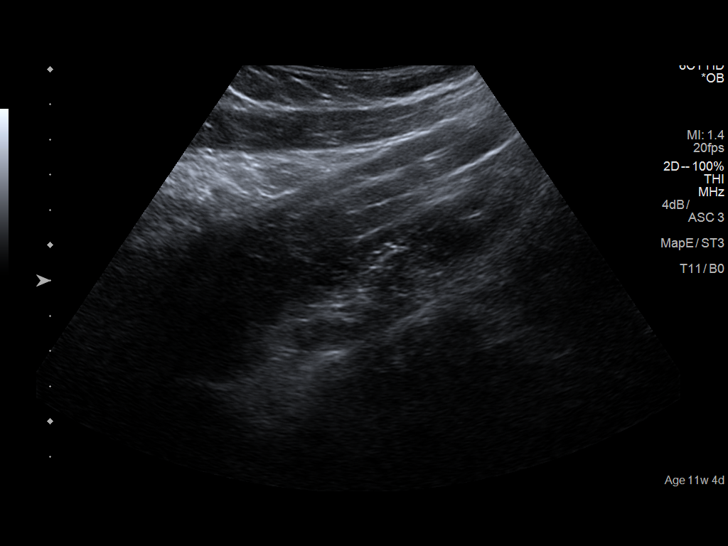
[im 38/42]
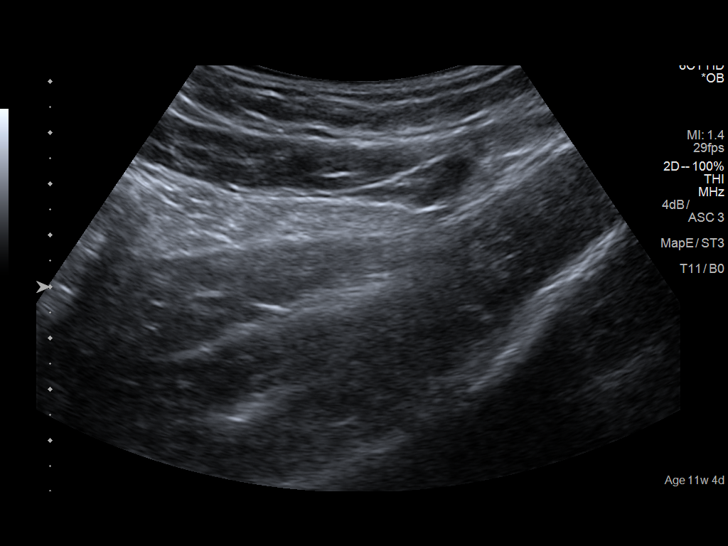
[im 42/42]
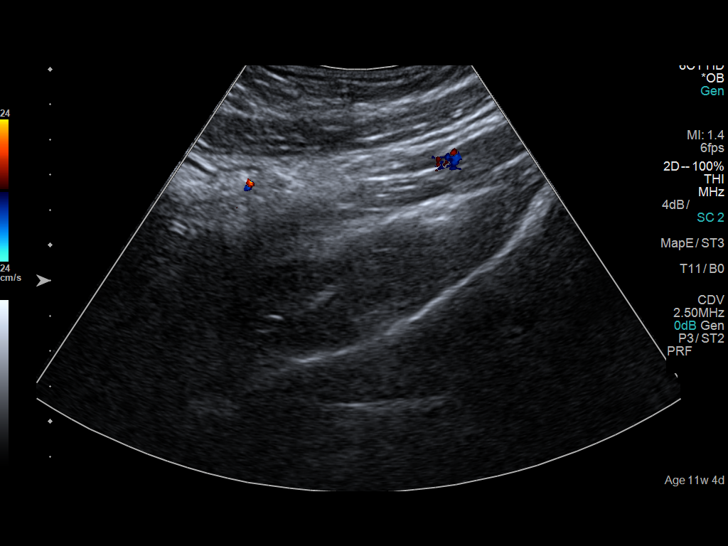

[14 of 28 positions shown; findings below may reference images not displayed]

FINDINGS: Intrauterine gestational sac: Single intrauterine gestational sac.

Yolk sac:  Not seen

Embryo:  Present

Cardiac Activity: Detected

Heart Rate: 160 bpm

CRL:   61 mm   12 w 4 d                  US EDC: 02/14/2018

Subchorionic hemorrhage: Faint hypoechoic area in the fundus is
favored to represent artifact and vessels. A minimal subchorionic
hemorrhage is less likely.

Maternal uterus/adnexae: Not visualized
IMPRESSION: Single live intrauterine pregnancy with an estimated gestational age
of 12 weeks, 4 days based on today's crown-rump length.

## 2020-09-30 ENCOUNTER — Ambulatory Visit (INDEPENDENT_AMBULATORY_CARE_PROVIDER_SITE_OTHER): Payer: BC Managed Care – PPO | Admitting: Medical

## 2020-09-30 ENCOUNTER — Other Ambulatory Visit: Payer: Self-pay

## 2020-09-30 VITALS — BP 122/78 | HR 81 | Wt 170.2 lb

## 2020-09-30 DIAGNOSIS — Z30017 Encounter for initial prescription of implantable subdermal contraceptive: Secondary | ICD-10-CM

## 2020-09-30 DIAGNOSIS — Z3202 Encounter for pregnancy test, result negative: Secondary | ICD-10-CM | POA: Diagnosis not present

## 2020-09-30 LAB — POCT PREGNANCY, URINE: Preg Test, Ur: NEGATIVE

## 2020-09-30 MED ORDER — ETONOGESTREL 68 MG ~~LOC~~ IMPL
68.0000 mg | DRUG_IMPLANT | Freq: Once | SUBCUTANEOUS | Status: AC
  Administered 2020-09-30: 10:00:00 68 mg via SUBCUTANEOUS

## 2020-09-30 NOTE — Progress Notes (Signed)
GYNECOLOGY CLINIC PROCEDURE NOTE  Ms. Kim Blanchard is a 25 y.o. [id number] here for Nexplanon insertion. Last pap smear was on 03/25/2019 and was normal.  No other gynecologic concerns.  Nexplanon Insertion Procedure Patient was given informed consent, she signed consent form.  Patient does understand that irregular bleeding is a very common side effect of this medication. She was advised to have backup contraception for one week after placement. Pregnancy test in clinic today was negative.  Appropriate time out taken.  Patient's left arm was prepped in the usual  fashion. I measured and marked the insertion area.  Patient was prepped with alcohol swab and then injected with 3 ml of 1% lidocaine.  She was prepped with betadine, Nexplanon removed from packaging,  Device confirmed in needle, then inserted full length of needle and withdrawn per handbook instructions. Nexplanon was able to palpated in the patient's arm; patient palpated the insert herself. There was minimal blood loss.  Patient insertion site covered with guaze and a pressure bandage to reduce any bruising.  The patient tolerated the procedure well and was given post procedure instructions.   Patient states with last Nexplanon she occasionally need OCPs to control bleeding. Advised that if she feels that is needed to contact the office anytime. Patient is still breastfeeding.   Marny Lowenstein, PA-C 09/30/2020 9:46 AM

## 2020-11-07 ENCOUNTER — Ambulatory Visit: Payer: Medicaid Other | Admitting: Obstetrics and Gynecology

## 2020-11-07 ENCOUNTER — Ambulatory Visit (INDEPENDENT_AMBULATORY_CARE_PROVIDER_SITE_OTHER): Payer: Medicaid Other | Admitting: *Deleted

## 2020-11-07 ENCOUNTER — Other Ambulatory Visit: Payer: Self-pay

## 2020-11-07 DIAGNOSIS — Z3201 Encounter for pregnancy test, result positive: Secondary | ICD-10-CM

## 2020-11-07 DIAGNOSIS — Z3200 Encounter for pregnancy test, result unknown: Secondary | ICD-10-CM

## 2020-11-07 LAB — POCT PREGNANCY, URINE: Preg Test, Ur: POSITIVE — AB

## 2020-11-07 NOTE — Progress Notes (Signed)
Patient was assessed and managed by nursing staff during this encounter. I have reviewed the chart and agree with the documentation and plan. I have also made any necessary editorial changes.  Kalasia Crafton A Freddi Schrager, MD 11/07/2020 12:54 PM   

## 2020-11-07 NOTE — Patient Instructions (Signed)
Prenatal Care Providers           [facility]  [address] [phone]  [facility]   [address]  [phone]  [facility] [place]       [address] [phone]            [facility] [place]     [address] [phone]          [facility] [place]   [address] [phone]  [facility]  [address] [phone]     [facility]  [address]   [phone]     [facility]  Phone: [phone]  [facility]  Phone: [phone]  [facility] Phone: [phone]  [facility] Phone: [phone]  [facility] Phone: [phone]  [facility] Phone: [phone]  [facility]  Phone: [phone]  

## 2020-11-07 NOTE — Progress Notes (Signed)
Patient dropped off urine for urine pregnancy test. Urine was positive for pregnancy. I called Rayen with [employer] [phone] and left message I was calling regarding your appointment/ results.  Jamaria Amborn,RN I called patient a second time with same interpreter and patient answered and confirmed her identity with 2 identifiers. I informed her of positive pregnancy test. She reports LMP 09/16/20 , this makes her [redacted]w[redacted]d with EDD 06/23/21. She reports she has the nexplanon still in her arm, per chart was inserted 09/30/20. I discussed with Dr. Alysia Penna and advised patient it will need to come out and I will have registrar schedule and call her with appointment. I infomed her we recommend she start prenatal care by 10 weeks and start prenatal vitamins asap.  She states she would like to start care with [place] as she has gone here before. I explained I will have registar call her with appointments.She voices understanding.  Harlo Fabela,RN

## 2021-01-04 ENCOUNTER — Ambulatory Visit: Payer: BC Managed Care – PPO | Admitting: Family Medicine

## 2021-02-09 ENCOUNTER — Ambulatory Visit (INDEPENDENT_AMBULATORY_CARE_PROVIDER_SITE_OTHER): Payer: BC Managed Care – PPO | Admitting: Obstetrics & Gynecology

## 2021-02-09 ENCOUNTER — Other Ambulatory Visit: Payer: Self-pay

## 2021-02-09 ENCOUNTER — Encounter: Payer: Self-pay | Admitting: Obstetrics & Gynecology

## 2021-02-09 VITALS — BP 121/78 | HR 80 | Wt 172.2 lb

## 2021-02-09 DIAGNOSIS — Z3202 Encounter for pregnancy test, result negative: Secondary | ICD-10-CM

## 2021-02-09 DIAGNOSIS — Z3046 Encounter for surveillance of implantable subdermal contraceptive: Secondary | ICD-10-CM | POA: Diagnosis not present

## 2021-02-09 LAB — POCT PREGNANCY, URINE: Preg Test, Ur: NEGATIVE

## 2021-02-09 MED ORDER — PRENATAL 28-0.8 MG PO TABS: 1.0000 | ORAL_TABLET | Freq: Every day | ORAL | 12 refills | Status: AC

## 2021-02-09 NOTE — Patient Instructions (Signed)
Nexplanon Instructions After [place];  Keep bandage clean and dry for 24 hours  May use ice/Tylenol/Ibuprofen for soreness or pain  If you develop fever, drainage or increased warmth from incision site-contact office immediately

## 2021-02-09 NOTE — Progress Notes (Signed)
    GYNECOLOGY OFFICE PROCEDURE NOTE  Kim Blanchard is a 25 y.o. [id number] here for Nexplanon removal. Patient is Kinyar[place]anda-speaking only, interpreter present for this encounter.  Had Nexplanon placed postpartum on 09/30/2020 but found out she was pregnant in 10/2020. Was told to come in for removal and start prenatal care.  She was not seen until today. She reported having some bleeding in 10/2020, feels she miscarried, reported negative home UPT. Still having irregular bleeding attributed to Nexplanon. She wants it removed, desires pregnancy soon.  Last pap smear was on 03/25/2019 and was normal.  No other gynecologic concerns.  Results for orders placed or performed in visit on 02/09/21 (from the past 24 hour(s))  Pregnancy, urine POC     Status: None   Collection Time: 02/09/21 11:29 AM  Result Value Ref Range   Preg Test, Ur NEGATIVE NEGATIVE    Nexplanon Removal Patient identified, informed consent performed, consent signed.   Appropriate time out taken. Nexplanon site identified.  Area prepped in usual sterile fashon. One ml of 1% lidocaine was used to anesthetize the area at the distal end of the implant. A small stab incision was made right beside the implant on the distal portion.  The Nexplanon rod was grasped using hemostats and removed without difficulty.  There was minimal blood loss. There were no complications.  3 ml of 1% lidocaine was injected around the incision for post-procedure analgesia.  Steri-strips were applied over the small incision.  A pressure bandage was applied to reduce any bruising.  The patient tolerated the procedure well and was given post procedure instructions. Negative UPT today, so consistent with early miscarriage in 10/2020 as suspected by patient.  Patient is planning to attempt conception, prenatal vitamins prescribed.   Jaynie Collins, MD, FACOG Obstetrician & Gynecologist, [facility] for [employer], [facility]

## 2023-03-10 ENCOUNTER — Other Ambulatory Visit: Payer: Self-pay

## 2023-03-10 ENCOUNTER — Emergency Department (HOSPITAL_COMMUNITY)
Admission: EM | Admit: 2023-03-10 | Discharge: 2023-03-11 | Disposition: A | Discharge status: Home / Self Care | Payer: 59 | Attending: Emergency Medicine | Admitting: Emergency Medicine

## 2023-03-10 ENCOUNTER — Encounter (HOSPITAL_COMMUNITY): Payer: Self-pay

## 2023-03-10 DIAGNOSIS — Y92410 Unspecified street and highway as the place of occurrence of the external cause: Secondary | ICD-10-CM | POA: Insufficient documentation

## 2023-03-10 DIAGNOSIS — R1032 Left lower quadrant pain: Secondary | ICD-10-CM | POA: Diagnosis present

## 2023-03-10 MED ORDER — MORPHINE SULFATE (PF) 4 MG/ML IV SOLN
4.0000 mg | Freq: Once | INTRAVENOUS | Status: AC
  Administered 2023-03-10: 4 mg via INTRAVENOUS
  Filled 2023-03-10: qty 1

## 2023-03-10 MED ORDER — ONDANSETRON HCL 4 MG/2ML IJ SOLN
4.0000 mg | Freq: Once | INTRAMUSCULAR | Status: AC
  Administered 2023-03-10: 4 mg via INTRAVENOUS
  Filled 2023-03-10: qty 2
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
## 2023-03-11 ENCOUNTER — Emergency Department (HOSPITAL_COMMUNITY): Payer: 59

## 2023-03-11 LAB — CBC WITH DIFFERENTIAL/PLATELET
Abs Immature Granulocytes: 0.01 10*3/uL (ref 0.00–0.07)
Basophils Absolute: 0 10*3/uL (ref 0.0–0.1)
Basophils Relative: 0 %
Eosinophils Absolute: 0.1 10*3/uL (ref 0.0–0.5)
Eosinophils Relative: 1 %
HCT: 38 % (ref 36.0–46.0)
Hemoglobin: 12.8 g/dL (ref 12.0–15.0)
Immature Granulocytes: 0 %
Lymphocytes Relative: 30 %
Lymphs Abs: 2.2 10*3/uL (ref 0.7–4.0)
MCH: 31.5 pg (ref 26.0–34.0)
MCHC: 33.7 g/dL (ref 30.0–36.0)
MCV: 93.6 fL (ref 80.0–100.0)
Monocytes Absolute: 0.6 10*3/uL (ref 0.1–1.0)
Monocytes Relative: 9 %
Neutro Abs: 4.2 10*3/uL (ref 1.7–7.7)
Neutrophils Relative %: 60 %
Platelets: 277 10*3/uL (ref 150–400)
RBC: 4.06 MIL/uL (ref 3.87–5.11)
RDW: 11.3 % — ABNORMAL LOW (ref 11.5–15.5)
WBC: 7.2 10*3/uL (ref 4.0–10.5)
nRBC: 0 % (ref 0.0–0.2)

## 2023-03-11 LAB — COMPREHENSIVE METABOLIC PANEL
ALT: 27 U/L (ref 0–44)
AST: 24 U/L (ref 15–41)
Albumin: 3.9 g/dL (ref 3.5–5.0)
Alkaline Phosphatase: 55 U/L (ref 38–126)
Anion gap: 8 (ref 5–15)
BUN: 12 mg/dL (ref 6–20)
CO2: 23 mmol/L (ref 22–32)
Calcium: 8.9 mg/dL (ref 8.9–10.3)
Chloride: 107 mmol/L (ref 98–111)
Creatinine, Ser: 0.75 mg/dL (ref 0.44–1.00)
GFR, Estimated: >60 mL/min (ref >60)
Glucose, Bld: 88 mg/dL (ref 70–99)
Potassium: 4.2 mmol/L (ref 3.5–5.1)
Sodium: 138 mmol/L (ref 135–145)
Total Bilirubin: 0.5 mg/dL (ref <1.2)
Total Protein: 7.3 g/dL (ref 6.5–8.1)

## 2023-03-11 LAB — URINALYSIS, ROUTINE W REFLEX MICROSCOPIC
Bilirubin Urine: NEGATIVE
Glucose, UA: NEGATIVE mg/dL
Hgb urine dipstick: NEGATIVE
Ketones, ur: NEGATIVE mg/dL
Leukocytes,Ua: NEGATIVE
Nitrite: NEGATIVE
Protein, ur: NEGATIVE mg/dL
Specific Gravity, Urine: 1.013 (ref 1.005–1.030)
pH: 7 (ref 5.0–8.0)

## 2023-03-11 LAB — HCG, SERUM, QUALITATIVE: Preg, Serum: NEGATIVE

## 2023-03-11 LAB — PREGNANCY, URINE: Preg Test, Ur: NEGATIVE

## 2023-03-11 MED ORDER — CYCLOBENZAPRINE HCL 10 MG PO TABS: 10.0000 mg | ORAL_TABLET | Freq: Three times a day (TID) | ORAL | 0 refills | Status: AC | PRN

## 2023-03-11 MED ORDER — IOHEXOL 300 MG/ML  SOLN
100.0000 mL | Freq: Once | INTRAMUSCULAR | Status: AC | PRN
  Administered 2023-03-11: 100 mL via INTRAVENOUS

## 2023-03-11 MED ORDER — IBUPROFEN 600 MG PO TABS: 600.0000 mg | ORAL_TABLET | Freq: Four times a day (QID) | ORAL | 0 refills | Status: AC | PRN

## 2023-03-11 NOTE — ED Notes (Signed)
Patient transported to CT 
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
